# Patient Record
Sex: Male | Born: 1990
Health system: Southern US, Community
[De-identification: ages and names within clinical notes are randomized; demographics above are authoritative.]

## PROBLEM LIST (undated history)

## (undated) DIAGNOSIS — Z21 Asymptomatic human immunodeficiency virus [HIV] infection status: Secondary | ICD-10-CM

## (undated) DIAGNOSIS — B20 Human immunodeficiency virus [HIV] disease: Secondary | ICD-10-CM

## (undated) DIAGNOSIS — G43909 Migraine, unspecified, not intractable, without status migrainosus: Secondary | ICD-10-CM

## (undated) HISTORY — DX: Human immunodeficiency virus (HIV) disease: B20

## (undated) HISTORY — DX: Migraine, unspecified, not intractable, without status migrainosus: G43.909

## (undated) HISTORY — DX: Asymptomatic human immunodeficiency virus (hiv) infection status: Z21

## (undated) HISTORY — PX: NO PAST SURGERIES: SHX2092

---

## 2016-12-08 ENCOUNTER — Emergency Department (HOSPITAL_COMMUNITY)
Admission: EM | Admit: 2016-12-08 | Discharge: 2016-12-09 | Disposition: A | Discharge status: Home / Self Care | Payer: BC Managed Care – PPO | Attending: Emergency Medicine | Admitting: Emergency Medicine

## 2016-12-08 ENCOUNTER — Encounter (HOSPITAL_COMMUNITY): Payer: Self-pay | Admitting: Emergency Medicine

## 2016-12-08 DIAGNOSIS — R1013 Epigastric pain: Secondary | ICD-10-CM | POA: Diagnosis present

## 2016-12-08 DIAGNOSIS — K2980 Duodenitis without bleeding: Secondary | ICD-10-CM | POA: Diagnosis not present

## 2016-12-08 LAB — COMPREHENSIVE METABOLIC PANEL
ALT: 23 U/L (ref 17–63)
AST: 23 U/L (ref 15–41)
Albumin: 3.9 g/dL (ref 3.5–5.0)
Alkaline Phosphatase: 60 U/L (ref 38–126)
Anion gap: 7 (ref 5–15)
BUN: 10 mg/dL (ref 6–20)
CHLORIDE: 105 mmol/L (ref 101–111)
CO2: 27 mmol/L (ref 22–32)
CREATININE: 0.86 mg/dL (ref 0.61–1.24)
Calcium: 9.1 mg/dL (ref 8.9–10.3)
Glucose, Bld: 111 mg/dL — ABNORMAL HIGH (ref 65–99)
POTASSIUM: 4.3 mmol/L (ref 3.5–5.1)
Sodium: 139 mmol/L (ref 135–145)
Total Bilirubin: 0.2 mg/dL — ABNORMAL LOW (ref 0.3–1.2)
Total Protein: 7.5 g/dL (ref 6.5–8.1)

## 2016-12-08 LAB — CBC
HEMATOCRIT: 42 % (ref 39.0–52.0)
Hemoglobin: 14.3 g/dL (ref 13.0–17.0)
MCH: 28 pg (ref 26.0–34.0)
MCHC: 34 g/dL (ref 30.0–36.0)
MCV: 82.4 fL (ref 78.0–100.0)
PLATELETS: 373 10*3/uL (ref 150–400)
RBC: 5.1 MIL/uL (ref 4.22–5.81)
RDW: 13.3 % (ref 11.5–15.5)
WBC: 9.6 10*3/uL (ref 4.0–10.5)

## 2016-12-08 LAB — LIPASE, BLOOD: LIPASE: 22 U/L (ref 11–51)

## 2016-12-08 NOTE — ED Triage Notes (Signed)
Pt states he has had abd pain for several days worse for the past two  Pt states he has had diarrhea without vomiting  Pain is mainly in the upper abdomen  Has taken OTC meds without relief

## 2016-12-09 ENCOUNTER — Emergency Department (HOSPITAL_COMMUNITY): Payer: BC Managed Care – PPO

## 2016-12-09 ENCOUNTER — Encounter (HOSPITAL_COMMUNITY): Payer: Self-pay

## 2016-12-09 LAB — URINALYSIS, ROUTINE W REFLEX MICROSCOPIC
Bacteria, UA: NONE SEEN
Bilirubin Urine: NEGATIVE
Glucose, UA: NEGATIVE mg/dL
Hgb urine dipstick: NEGATIVE
KETONES UR: NEGATIVE mg/dL
Nitrite: NEGATIVE
PH: 6 (ref 5.0–8.0)
PROTEIN: NEGATIVE mg/dL
Specific Gravity, Urine: 1.024 (ref 1.005–1.030)

## 2016-12-09 MED ORDER — DICYCLOMINE HCL 20 MG PO TABS: 20.0000 mg | ORAL_TABLET | Freq: Two times a day (BID) | ORAL | 0 refills | Status: DC | PRN

## 2016-12-09 MED ORDER — PANTOPRAZOLE SODIUM 40 MG IV SOLR
40.0000 mg | Freq: Once | INTRAVENOUS | Status: AC
  Administered 2016-12-09: 40 mg via INTRAVENOUS
  Filled 2016-12-09: qty 40

## 2016-12-09 MED ORDER — SODIUM CHLORIDE 0.9 % IV BOLUS (SEPSIS)
1000.0000 mL | Freq: Once | INTRAVENOUS | Status: AC
  Administered 2016-12-09: 1000 mL via INTRAVENOUS

## 2016-12-09 MED ORDER — IOPAMIDOL (ISOVUE-300) INJECTION 61%
100.0000 mL | Freq: Once | INTRAVENOUS | Status: AC | PRN
  Administered 2016-12-09: 100 mL via INTRAVENOUS

## 2016-12-09 MED ORDER — IOPAMIDOL (ISOVUE-300) INJECTION 61%
INTRAVENOUS | Status: AC
  Filled 2016-12-09: qty 100

## 2016-12-09 MED ORDER — DICYCLOMINE HCL 10 MG/ML IM SOLN
20.0000 mg | Freq: Once | INTRAMUSCULAR | Status: AC
  Administered 2016-12-09: 20 mg via INTRAMUSCULAR
  Filled 2016-12-09: qty 2

## 2016-12-09 MED ORDER — PANTOPRAZOLE SODIUM 20 MG PO TBEC: 40.0000 mg | DELAYED_RELEASE_TABLET | Freq: Every day | ORAL | 1 refills | Status: DC

## 2016-12-09 MED ORDER — ONDANSETRON HCL 4 MG/2ML IJ SOLN
4.0000 mg | Freq: Once | INTRAMUSCULAR | Status: AC
  Administered 2016-12-09: 4 mg via INTRAVENOUS
  Filled 2016-12-09: qty 2

## 2016-12-09 NOTE — ED Notes (Signed)
Patient attempted to defecate, but was unsuccessful. Pt states "There was a lot of pressure, but nothing came out."

## 2016-12-09 NOTE — ED Provider Notes (Signed)
[facility]-EMERGENCY DEPT Provider Note   CSN: [id number] Arrival date & time: 12/08/16  2139    History   Chief Complaint Chief Complaint  Patient presents with  . Abdominal Pain    HPI Darin [name] is a 26 y.o. male.  26 year old male with no significant past medical history presents to the emergency department for evaluation of abdominal pain. Patient reports having abdominal cramping over the past 2-3 weeks. Cramping has been significantly worse over the past 3 days. Cramping is most notable in the patient's epigastrium. He has had multiple episodes of diarrhea daily. Symptoms not relieved with Pepto-Bismol. No nausea or vomiting. Patient states that he has had some perianal soreness of the result of frequent bowel movements. He did notice a bowel movement mixed with bright red blood today. No history of hematochezia previously. Patient denies history of abdominal surgeries. No known family history of IBS or IBD. No fevers or urinary symptoms or questionable food ingestions. Patient denies drinking water from a creek or stream. No contact with sick persons.      History reviewed. No pertinent past medical history.  There are no active problems to display for this patient.   History reviewed. No pertinent surgical history.     Home Medications    Prior to Admission medications   Medication Sig Start Date End Date Taking? Authorizing Provider  bismuth subsalicylate (PEPTO BISMOL) 262 MG/15ML suspension Take 30 mLs by mouth every 6 (six) hours as needed for indigestion.   Yes [provider]  dicyclomine (BENTYL) 20 MG tablet Take 1 tablet (20 mg total) by mouth 2 (two) times daily as needed (abdominal pain/cramping). 12/09/16   Antony MaduraHumes, Ansley Stanwood, PA-C  pantoprazole (PROTONIX) 20 MG tablet Take 2 tablets (40 mg total) by mouth daily. 12/09/16   Antony MaduraHumes, Simuel Stebner, PA-C    Family History History reviewed. No pertinent family history.  Social History Social History  Substance Use  Topics  . Smoking status: Never Smoker  . Smokeless tobacco: Never Used  . Alcohol use Yes     Comment: occ     Allergies   Penicillins   Review of Systems Review of Systems Ten systems reviewed and are negative for acute change, except as noted in the HPI.    Physical Exam Updated Vital Signs BP 108/62 (BP Location: Left Arm)   Pulse 62   Temp 97.9 F (36.6 C) (Oral)   Resp 18   SpO2 100%   Physical Exam  Constitutional: He is oriented to person, place, and time. He appears well-developed and well-nourished. No distress.  Nontoxic appearing and in NAD. Seems uncomfortable.  HENT:  Head: Normocephalic and atraumatic.  Eyes: Conjunctivae and EOM are normal. No scleral icterus.  Neck: Normal range of motion.  Cardiovascular: Normal rate, regular rhythm and intact distal pulses.   Pulmonary/Chest: Effort normal. No respiratory distress. He has no wheezes. He has no rales.  Respirations even and unlabored  Abdominal: Soft. He exhibits no mass. There is tenderness. There is guarding.  Soft abdomen with epigastric TTP and voluntary guarding. No distension or peritoneal signs.  Musculoskeletal: Normal range of motion.  Neurological: He is alert and oriented to person, place, and time. He exhibits normal muscle tone. Coordination normal.  Skin: Skin is warm and dry. No rash noted. He is not diaphoretic. No erythema. No pallor.  Psychiatric: He has a normal mood and affect. His behavior is normal.  Nursing note and vitals reviewed.    ED Treatments / Results  Labs (all  labs ordered are listed, but only abnormal results are displayed) Labs Reviewed  COMPREHENSIVE METABOLIC PANEL - Abnormal; Notable for the following:       Result Value   Glucose, Bld 111 (*)    Total Bilirubin 0.2 (*)    All other components within normal limits  URINALYSIS, ROUTINE W REFLEX MICROSCOPIC - Abnormal; Notable for the following:    Leukocytes, UA TRACE (*)    Squamous Epithelial / LPF 0-5  (*)    All other components within normal limits  GASTROINTESTINAL PANEL BY PCR, STOOL (REPLACES STOOL CULTURE)  LIPASE, BLOOD  CBC    EKG  EKG Interpretation None       Radiology Ct Abdomen Pelvis W Contrast  Result Date: 12/09/2016 CLINICAL DATA:  Upper abdominal pain for several days worse over the past 2 days with diarrhea. EXAM: CT ABDOMEN AND PELVIS WITH CONTRAST TECHNIQUE: Multidetector CT imaging of the abdomen and pelvis was performed using the standard protocol following bolus administration of intravenous contrast. CONTRAST:  ISOVUE-300 IOPAMIDOL (ISOVUE-300) INJECTION 61% COMPARISON:  None. FINDINGS: Lower chest: No acute abnormality. Hepatobiliary: Homogeneous attenuation of the liver without mass or biliary dilatation. Contracted gallbladder without stones. Pancreas: Normal Spleen: Normal Adrenals/Urinary Tract: Normal Stomach/Bowel: Physiologically distended stomach. There is mild thickening of the duodenum along its second and third portions which may represent changes of duodenitis. No bowel obstruction is seen. There is increased colonic stool burden throughout large bowel. The appendix is normal. Vascular/Lymphatic: No significant vascular findings are present. No enlarged abdominal or pelvic lymph nodes. Reproductive: Prostate is unremarkable. Other: No abdominal wall hernia or abnormality. No abdominopelvic ascites. Musculoskeletal: No acute or significant osseous findings. IMPRESSION: 1. Slight thickened appearance of the duodenum raising the possibility of a duodenitis. This may explain the patient's upper abdominal pain. No mechanical source of bowel obstruction. 2. No acute solid organ pathology. Electronically Signed   By: Tollie Eth M.D.   On: 12/09/2016 02:48    Procedures Procedures (including critical care time)  Medications Ordered in ED Medications  iopamidol (ISOVUE-300) 61 % injection (not administered)  sodium chloride 0.9 % bolus 1,000 mL (0 mLs  Intravenous Stopped 12/09/16 0300)  dicyclomine (BENTYL) injection 20 mg (20 mg Intramuscular Given 12/09/16 0143)  ondansetron (ZOFRAN) injection 4 mg (4 mg Intravenous Given 12/09/16 0142)  iopamidol (ISOVUE-300) 61 % injection 100 mL (100 mLs Intravenous Contrast Given 12/09/16 0203)  pantoprazole (PROTONIX) injection 40 mg (40 mg Intravenous Given 12/09/16 0441)     Initial Impression / Assessment and Plan / ED Course  I have reviewed the triage vital signs and the nursing notes.  Pertinent labs & imaging results that were available during my care of the patient were reviewed by me and considered in my medical decision making (see chart for details).     26 year old male presents to the emergency department for evaluation of persistent upper abdominal pain. Laboratory workup is reassuring. CT was obtained given degree of discomfort on exam. This shows findings consistent with duodenitis.  On repeat assessment, patient appears much more comfortable. He states that his discomfort has largely subsided following fluids and Bentyl. CT findings reviewed with patient and friend at bedside. Plan to refer to gastroenterology given chronicity of symptoms. We will also start on Protonix. Return precautions discussed and provided. Patient discharged in stable condition with no unaddressed concerns.   Final Clinical Impressions(s) / ED Diagnoses   Final diagnoses:  Acute duodenitis    New Prescriptions Discharge Medication List as  of 12/09/2016  4:05 AM    START taking these medications   Details  dicyclomine (BENTYL) 20 MG tablet Take 1 tablet (20 mg total) by mouth 2 (two) times daily as needed (abdominal pain/cramping)., Starting Wed 12/09/2016, Print    pantoprazole (PROTONIX) 20 MG tablet Take 2 tablets (40 mg total) by mouth daily., Starting Wed 12/09/2016, Print         Antony Madura, PA-C 12/09/16 1610    Zadie Rhine, MD 12/09/16 [phone]

## 2016-12-09 NOTE — Discharge Instructions (Signed)
We advise that you stop taking ibuprofen, Motrin, or Aleve as these may worsen your symptoms. Avoid alcohol. Take Protonix daily as prescribed. You may take Bentyl as needed for abdominal cramping. This can be supplemented with Tylenol, as needed. Call to schedule close follow up with a GI doctor. You may return for new or concerning symptoms.

## 2018-03-17 ENCOUNTER — Other Ambulatory Visit: Payer: Self-pay

## 2018-03-17 ENCOUNTER — Emergency Department (HOSPITAL_COMMUNITY)
Admission: EM | Admit: 2018-03-17 | Discharge: 2018-03-17 | Disposition: A | Discharge status: Home / Self Care | Payer: Managed Care, Other (non HMO) | Attending: Emergency Medicine | Admitting: Emergency Medicine

## 2018-03-17 ENCOUNTER — Encounter (HOSPITAL_COMMUNITY): Payer: Self-pay | Admitting: Emergency Medicine

## 2018-03-17 DIAGNOSIS — L02211 Cutaneous abscess of abdominal wall: Secondary | ICD-10-CM | POA: Insufficient documentation

## 2018-03-17 DIAGNOSIS — R1032 Left lower quadrant pain: Secondary | ICD-10-CM | POA: Diagnosis present

## 2018-03-17 NOTE — ED Triage Notes (Signed)
Patient has an abscess on left lower quadrant that has busted and is oozing.

## 2018-03-17 NOTE — ED Provider Notes (Signed)
See Epic downtime documentation.   Darin [name], Darin RuizJohn, MD 03/17/18 [phone]

## 2018-03-17 NOTE — ED Notes (Signed)
Bed: WA01 Expected date:  Expected time:  Means of arrival:  Comments: 

## 2018-05-04 ENCOUNTER — Other Ambulatory Visit (HOSPITAL_COMMUNITY)
Admission: RE | Admit: 2018-05-04 | Discharge: 2018-05-04 | Disposition: A | Discharge status: Home / Self Care | Payer: Managed Care, Other (non HMO) | Source: Ambulatory Visit | Attending: Infectious Diseases | Admitting: Infectious Diseases

## 2018-05-04 ENCOUNTER — Other Ambulatory Visit: Payer: Self-pay | Admitting: *Deleted

## 2018-05-04 ENCOUNTER — Ambulatory Visit: Payer: Managed Care, Other (non HMO)

## 2018-05-04 ENCOUNTER — Other Ambulatory Visit: Payer: Managed Care, Other (non HMO)

## 2018-05-04 DIAGNOSIS — B20 Human immunodeficiency virus [HIV] disease: Secondary | ICD-10-CM | POA: Insufficient documentation

## 2018-05-05 LAB — T-HELPER CELL (CD4) - (RCID CLINIC ONLY)
CD4 % Helper T Cell: 33 % (ref 33–55)
CD4 T Cell Abs: 770 /uL (ref 400–2700)

## 2018-05-05 LAB — URINE CYTOLOGY ANCILLARY ONLY
Chlamydia: NEGATIVE
Neisseria Gonorrhea: NEGATIVE

## 2018-05-12 ENCOUNTER — Encounter: Payer: Self-pay | Admitting: Infectious Diseases

## 2018-05-14 LAB — CBC WITH DIFFERENTIAL/PLATELET
BASOS ABS: 17 {cells}/uL (ref 0–200)
Basophils Relative: 0.3 %
Eosinophils Absolute: 80 cells/uL (ref 15–500)
Eosinophils Relative: 1.4 %
HEMATOCRIT: 45.5 % (ref 38.5–50.0)
Hemoglobin: 15.3 g/dL (ref 13.2–17.1)
Lymphs Abs: 2092 cells/uL (ref 850–3900)
MCH: 27.5 pg (ref 27.0–33.0)
MCHC: 33.6 g/dL (ref 32.0–36.0)
MCV: 81.8 fL (ref 80.0–100.0)
MPV: 10.1 fL (ref 7.5–12.5)
Monocytes Relative: 7.2 %
NEUTROS PCT: 54.4 %
Neutro Abs: 3101 cells/uL (ref 1500–7800)
PLATELETS: 360 10*3/uL (ref 140–400)
RBC: 5.56 10*6/uL (ref 4.20–5.80)
RDW: 13 % (ref 11.0–15.0)
TOTAL LYMPHOCYTE: 36.7 %
WBC mixed population: 410 cells/uL (ref 200–950)
WBC: 5.7 10*3/uL (ref 3.8–10.8)

## 2018-05-14 LAB — COMPLETE METABOLIC PANEL WITH GFR
AG Ratio: 1.6 (calc) (ref 1.0–2.5)
ALKALINE PHOSPHATASE (APISO): 55 U/L (ref 40–115)
ALT: 31 U/L (ref 9–46)
AST: 24 U/L (ref 10–40)
Albumin: 4.4 g/dL (ref 3.6–5.1)
BUN: 13 mg/dL (ref 7–25)
CALCIUM: 9.3 mg/dL (ref 8.6–10.3)
CO2: 26 mmol/L (ref 20–32)
Chloride: 105 mmol/L (ref 98–110)
Creat: 0.98 mg/dL (ref 0.60–1.35)
GFR, EST NON AFRICAN AMERICAN: 106 mL/min/{1.73_m2} (ref >=60)
GFR, Est African American: 123 mL/min/{1.73_m2} (ref >=60)
GLOBULIN: 2.8 g/dL (ref 1.9–3.7)
Glucose, Bld: 88 mg/dL (ref 65–99)
POTASSIUM: 4.3 mmol/L (ref 3.5–5.3)
SODIUM: 138 mmol/L (ref 135–146)
Total Bilirubin: 0.5 mg/dL (ref 0.2–1.2)
Total Protein: 7.2 g/dL (ref 6.1–8.1)

## 2018-05-14 LAB — HEPATITIS B CORE ANTIBODY, TOTAL: Hep B Core Total Ab: NONREACTIVE

## 2018-05-14 LAB — HIV ANTIBODY (ROUTINE TESTING W REFLEX): HIV: REACTIVE — AB

## 2018-05-14 LAB — QUANTIFERON-TB GOLD PLUS
MITOGEN-NIL: 9.95 [IU]/mL
NIL: 0.02 [IU]/mL
QuantiFERON-TB Gold Plus: NEGATIVE
TB1-NIL: 0 IU/mL
TB2-NIL: 0.01 [IU]/mL

## 2018-05-14 LAB — HEPATITIS A ANTIBODY, TOTAL: Hepatitis A AB,Total: REACTIVE — AB

## 2018-05-14 LAB — URINALYSIS
Bilirubin Urine: NEGATIVE
Glucose, UA: NEGATIVE
HGB URINE DIPSTICK: NEGATIVE
Ketones, ur: NEGATIVE
NITRITE: NEGATIVE
PROTEIN: NEGATIVE
Specific Gravity, Urine: 1.027 (ref 1.001–1.03)
pH: 5.5 (ref 5.0–8.0)

## 2018-05-14 LAB — HIV-1 GENOTYPE: HIV-1 Genotype: DETECTED — AB

## 2018-05-14 LAB — LIPID PANEL
Cholesterol: 165 mg/dL (ref <200)
HDL: 47 mg/dL (ref >40)
LDL CHOLESTEROL (CALC): 104 mg/dL — AB
Non-HDL Cholesterol (Calc): 118 mg/dL (calc) (ref <130)
TRIGLYCERIDES: 53 mg/dL (ref <150)
Total CHOL/HDL Ratio: 3.5 (calc) (ref <5.0)

## 2018-05-14 LAB — HEPATITIS C ANTIBODY
HEP C AB: NONREACTIVE
SIGNAL TO CUT-OFF: 0.09 (ref <1.00)

## 2018-05-14 LAB — HEPATITIS B SURFACE ANTIBODY,QUALITATIVE: Hep B S Ab: NONREACTIVE

## 2018-05-14 LAB — RPR: RPR Ser Ql: NONREACTIVE

## 2018-05-14 LAB — HIV-1 RNA ULTRAQUANT REFLEX TO GENTYP+
HIV 1 RNA QUANT: 14100 {copies}/mL — AB
HIV-1 RNA QUANT, LOG: 4.15 {Log_copies}/mL — AB

## 2018-05-14 LAB — HIV-1/2 AB - DIFFERENTIATION
HIV-1 antibody: POSITIVE — AB
HIV-2 Ab: NEGATIVE

## 2018-05-14 LAB — HLA B*5701: HLA-B*5701 w/rflx HLA-B High: NEGATIVE

## 2018-05-14 LAB — HEPATITIS B SURFACE ANTIGEN: HEP B S AG: NONREACTIVE

## 2018-05-22 ENCOUNTER — Encounter: Payer: Self-pay | Admitting: Infectious Diseases

## 2018-05-22 DIAGNOSIS — B20 Human immunodeficiency virus [HIV] disease: Secondary | ICD-10-CM | POA: Insufficient documentation

## 2018-05-23 ENCOUNTER — Ambulatory Visit (INDEPENDENT_AMBULATORY_CARE_PROVIDER_SITE_OTHER): Payer: Managed Care, Other (non HMO) | Admitting: Infectious Diseases

## 2018-05-23 ENCOUNTER — Encounter: Payer: Self-pay | Admitting: Infectious Diseases

## 2018-05-23 ENCOUNTER — Ambulatory Visit (INDEPENDENT_AMBULATORY_CARE_PROVIDER_SITE_OTHER): Payer: Managed Care, Other (non HMO) | Admitting: Pharmacist

## 2018-05-23 VITALS — BP 132/82 | HR 72 | Temp 97.5°F | Wt 211.0 lb

## 2018-05-23 DIAGNOSIS — Z23 Encounter for immunization: Secondary | ICD-10-CM | POA: Diagnosis not present

## 2018-05-23 DIAGNOSIS — Z21 Asymptomatic human immunodeficiency virus [HIV] infection status: Secondary | ICD-10-CM | POA: Diagnosis not present

## 2018-05-23 DIAGNOSIS — Z0000 Encounter for general adult medical examination without abnormal findings: Secondary | ICD-10-CM | POA: Insufficient documentation

## 2018-05-23 DIAGNOSIS — B20 Human immunodeficiency virus [HIV] disease: Secondary | ICD-10-CM | POA: Diagnosis not present

## 2018-05-23 MED ORDER — BICTEGRAVIR-EMTRICITAB-TENOFOV 50-200-25 MG PO TABS: 1.0000 | ORAL_TABLET | Freq: Every day | ORAL | 3 refills | Status: DC

## 2018-05-23 MED FILL — BIKTARVY 50-200-25 MG TABS: 50-200-25 | 30 days supply | Qty: 30 | Fill #0

## 2018-05-23 NOTE — Assessment & Plan Note (Signed)
New patient here to establish for HIV care. I discussed with Darin [name] treatment options/side effects, benefits of treatment and long-term outcomes. I discussed how HIV is transmitted and the process of untreated HIV including increased risk for opportunistic infections, cancer, dementia and renal failure. he was counseled on routine HIV care including medication adherence, blood monitoring, necessary vaccines and follow up visits. Counseled regarding safe sex practices including: condom use, partner disclosure, limiting partners. He has a partner that he has had unprotected sex with he is currently involved with and has not had testing yet - recommended he get tested either with partner test here or [facility]. He spent time talking with our pharmacist Cassie regarding successful practices of ART and understands to reach out to our clinic in the future with questions.   Will start him on Biktarvy. [facility] Will return to clinic in 4 weeks to re-check VL and continue HIV education with our pharmacy team and with me again in 8 weeks.   I spent greater than 45 minutes with the patient today. Greater than 50% of the time spent face-to-face counseling and coordination of care re: HIV and health maintenance.

## 2018-05-23 NOTE — Assessment & Plan Note (Addendum)
Prevnar vaccine today. He needs Hep B #1 at pharmacy visit. Will give Hep B #2 and Pneumovax at next appt with me. Will get childhood vax history to determine if he needs meningococcal and HPV as well.

## 2018-05-23 NOTE — Patient Instructions (Addendum)
Wonderful to meet you today!   Will start you on a new pill called Biktarvy once a day - this will need to be taken once a day around the same time.  - Common side effects for a short time frame usually include headaches, nausea and diarrhea - OK to take over the counter tylenol for headaches and imodium for diarrhea - Try taking with food if you are nauseated  - Please separate from other vitamins/supplements or minerals by 6 hours please.   Would like for you to follow up with Cassie in 4 weeks and Judeth CornfieldStephanie again in 2 months.   We gave you your Prevnar vaccine today and will give you your first hep b vaccine in 1 month at your next visit.    Please sign up with MyChart to access your labs and set up email communication with our clinic for non-urgent medical concerns.

## 2018-05-23 NOTE — Progress Notes (Signed)
HPI: Darin [name] is a 27 y.o. male who presents to the [facility] clinic to initiate care with Judeth Cornfield for his HIV infection.  Patient Active Problem List   Diagnosis Date Noted  . Healthcare maintenance 05/23/2018  . HIV (human immunodeficiency virus infection) (HCC) 05/22/2018    Patient's Medications  New Prescriptions   BICTEGRAVIR-EMTRICITABINE-TENOFOVIR AF (BIKTARVY) 50-200-25 MG TABS TABLET    Take 1 tablet by mouth daily.  Previous Medications   ASPIRIN-ACETAMINOPHEN-CAFFEINE (EXCEDRIN MIGRAINE) 250-250-65 MG TABLET    Take 2 tablets by mouth every 6 (six) hours as needed for headache.   BIOTIN 1 MG CAPS    Take by mouth.   BIOTIN 10 MG TABS    Take 1 tablet by mouth daily.  Modified Medications   No medications on file  Discontinued Medications   No medications on file    Allergies: Allergies  Allergen Reactions  . Penicillins Hives    As a kid  Has patient had a PCN reaction causing immediate rash, facial/tongue/throat swelling, SOB or lightheadedness with hypotension: Yes Has patient had a PCN reaction causing severe rash involving mucus membranes or skin necrosis: No Has patient had a PCN reaction that required hospitalization: No Has patient had a PCN reaction occurring within the last 10 years: No If all of the above answers are "NO", then may proceed with Cephalosporin use.     Past Medical History: Past Medical History:  Diagnosis Date  . HIV infection (HCC)   . Migraines     Social History: Social History   Socioeconomic History  . Marital status: Single    Spouse name: Not on file  . Number of children: Not on file  . Years of education: Not on file  . Highest education level: Not on file  Occupational History  . Not on file  Social Needs  . Financial resource strain: Not on file  . Food insecurity:    Worry: Not on file    Inability: Not on file  . Transportation needs:    Medical: Not on file    Non-medical: Not on file  Tobacco Use  .  Smoking status: Never Smoker  . Smokeless tobacco: Never Used  Substance and Sexual Activity  . Alcohol use: Yes    Comment: occ  . Drug use: No  . Sexual activity: Yes    Partners: Male    Birth control/protection: Condom  Lifestyle  . Physical activity:    Days per week: Not on file    Minutes per session: Not on file  . Stress: Not on file  Relationships  . Social connections:    Talks on phone: Not on file    Gets together: Not on file    Attends religious service: Not on file    Active member of club or organization: Not on file    Attends meetings of clubs or organizations: Not on file    Relationship status: Not on file  Other Topics Concern  . Not on file  Social History Narrative  . Not on file    Labs: Lab Results  Component Value Date   HIV1RNAQUANT 14,100 (H) 05/04/2018   CD4TABS 770 05/04/2018    RPR and STI Lab Results  Component Value Date   LABRPR NON-REACTIVE 05/04/2018    STI Results GC CT  05/04/2018 Negative Negative    Hepatitis B Lab Results  Component Value Date   HEPBSAB NON-REACTIVE 05/04/2018   HEPBSAG NON-REACTIVE 05/04/2018   HEPBCAB NON-REACTIVE 05/04/2018  Hepatitis C Lab Results  Component Value Date   HEPCAB NON-REACTIVE 05/04/2018   Hepatitis A Lab Results  Component Value Date   HAV REACTIVE (A) 05/04/2018   Lipids: Lab Results  Component Value Date   CHOL 165 05/04/2018   TRIG 53 05/04/2018   HDL 47 05/04/2018   CHOLHDL 3.5 05/04/2018   LDLCALC 104 (H) 05/04/2018    Current HIV Regimen: Naive  Assessment: Darin [name] comes in today to initiate care for his newly diagnosed HIV infection.  He has an initial HIV viral load of 14,100 and a CD4 count of 770.  His kidney function is good at baseline and he has no resistance on his genotype. We will start him on Biktarvy today.  Explained that Darin [name] is a one pill once daily medication with or without food and the importance of not missing any doses. Explained  resistance and how it develops and why it is so important to take Biktarvy daily and not skip days or doses. Counseled patient to take it around the same time each day. Counseled on what to do if dose is missed, if closer to missed dose take immediately, if closer to next dose then skip and resume normal schedule.   Cautioned on possible side effects the first week or so including nausea, diarrhea, dizziness, and headaches but that they should resolve after the first couple of weeks. I reviewed patient medications and found no drug interactions. Counseled patient to separate Biktarvy from divalent cations including multivitamins. Discussed with patient to call clinic if he starts a new medication or herbal supplement. I gave the patient my card and told him to call me with any issues/questions/concerns.  [facility]  Plan: - Start Biktarvy PO once daily [facility]  Darin [name], PharmD, AAHIVP, CPP Infectious Diseases Clinical Pharmacist [facility] 05/23/2018, 3:21 PM

## 2018-05-23 NOTE — Progress Notes (Signed)
Name: Darin RamShane Mattila  DOB: 1990/09/08 MRN: [id number] PCP: Jackie Plumsei-Bonsu, George, MD   Patient Active Problem List   Diagnosis Date Noted  . Healthcare maintenance 05/23/2018  . HIV (human immunodeficiency virus infection) (HCC) 05/22/2018     Brief Narrative:  Darin [name] is a 27 y.o. male with HIV infection originally dx 04/2018. History of OIs: none. HIV Risk: MSM  Previous Regimens: . naive  Genotypes: . 05-2018: no mutations detected   Subjective:  Darin [name] is here today to establish care for HIV disease.   Darin [name] is an otherwise 27 y.o. health young male. He is in school at [employer] for engineering. Working full time at [employer] as well. Previously was tested every 2 months but last tested in March of this year. He has heard of PrEP but never thought he needed it. He was initially shocked and paniced when he found out but feeling better now. Has researched a little bit about HIV and understands how it is transmitted. Endorses no complaints today suggestive of associated opportunistic infection or advancing HIV disease such as fevers, night sweats, weight loss, anorexia, cough, SOB, nausea, vomiting, diarrhea, headache, sensory changes, lymphadenopathy or oral thrush.  He is ready to start on medications if we recommend this.   He has had his flu shot this year. Exercises occasionally but will be off night shift soon so he can dedicate more time to this now. Eats varied diet and prefers to be a lower weight, has picked up some with stress and off-shift work. He prefers male partners and uses condoms most of the time. Thinks he may have had gonorrhea in the past. Was tested with oral/anal swabs in October and negative then.   Review of Systems  Constitutional: Negative for chills, fever, malaise/fatigue and weight loss.  HENT: Negative for sore throat.        No dental problems  Respiratory: Negative for cough and sputum production.   Cardiovascular: Negative for chest pain and leg swelling.   Gastrointestinal: Negative for abdominal pain, diarrhea and vomiting.  Genitourinary: Negative for dysuria and flank pain.  Musculoskeletal: Negative for joint pain, myalgias and neck pain.  Skin: Negative for rash.  Neurological: Negative for dizziness, tingling and headaches.  Psychiatric/Behavioral: Negative for depression and substance abuse. The patient is not nervous/anxious and does not have insomnia.     Past Medical History:  Diagnosis Date  . HIV infection (HCC)   . Migraines     Outpatient Medications Prior to Visit  Medication Sig Dispense Refill  . aspirin-acetaminophen-caffeine (EXCEDRIN MIGRAINE) 250-250-65 MG tablet Take 2 tablets by mouth every 6 (six) hours as needed for headache.    . Biotin 1 MG CAPS Take by mouth.    . Biotin 10 MG TABS Take 1 tablet by mouth daily.    Marland Kitchen. bismuth subsalicylate (PEPTO BISMOL) 262 MG/15ML suspension Take 30 mLs by mouth every 6 (six) hours as needed for indigestion.    . dicyclomine (BENTYL) 20 MG tablet Take 1 tablet (20 mg total) by mouth 2 (two) times daily as needed (abdominal pain/cramping). 20 tablet 0  . pantoprazole (PROTONIX) 20 MG tablet Take 2 tablets (40 mg total) by mouth daily. 60 tablet 1   No facility-administered medications prior to visit.      Allergies  Allergen Reactions  . Penicillins Hives    As a kid  Has patient had a PCN reaction causing immediate rash, facial/tongue/throat swelling, SOB or lightheadedness with hypotension: Yes Has patient had a PCN reaction  causing severe rash involving mucus membranes or skin necrosis: No Has patient had a PCN reaction that required hospitalization: No Has patient had a PCN reaction occurring within the last 10 years: No If all of the above answers are "NO", then may proceed with Cephalosporin use.     Social History   Tobacco Use  . Smoking status: Never Smoker  . Smokeless tobacco: Never Used  Substance Use Topics  . Alcohol use: Yes    Comment: occ  .  Drug use: No    Family History  Problem Relation Age of Onset  . Migraines Mother   . Healthy Mother   . Migraines Father   . Healthy Father   . Healthy Sister   . AAA (abdominal aortic aneurysm) Maternal Grandfather   . Hypertension Maternal Grandfather   . Diabetes Paternal Grandmother   . Cancer Paternal Grandmother   . Healthy Brother   . Healthy Brother     Social History   Substance and Sexual Activity  Sexual Activity Yes  . Partners: Male  . Birth control/protection: Condom     Objective:   Vitals:   05/23/18 0904  BP: 132/82  Pulse: 72  Temp: (!) 97.5 F (36.4 C)  TempSrc: Oral  Weight: 211 lb (95.7 kg)   Body mass index is 28.62 kg/m.  Physical Exam  Constitutional: He is oriented to person, place, and time. He appears well-developed and well-nourished.  Seated comfortably in chair during visit.   HENT:  Mouth/Throat: Oropharynx is clear and moist and mucous membranes are normal. Normal dentition. No dental abscesses.  Cardiovascular: Normal rate, regular rhythm and normal heart sounds.  Pulmonary/Chest: Effort normal and breath sounds normal.  Abdominal: Soft. He exhibits no distension. There is no tenderness.  Lymphadenopathy:    He has no cervical adenopathy.  Neurological: He is alert and oriented to person, place, and time.  Skin: Skin is warm and dry. No rash noted.  Psychiatric: He has a normal mood and affect. Judgment normal.  In good spirits today and engaged in care discussion.     Lab Results Lab Results  Component Value Date   WBC 5.7 05/04/2018   HGB 15.3 05/04/2018   HCT 45.5 05/04/2018   MCV 81.8 05/04/2018   PLT 360 05/04/2018    Lab Results  Component Value Date   CREATININE 0.98 05/04/2018   BUN 13 05/04/2018   NA 138 05/04/2018   K 4.3 05/04/2018   CL 105 05/04/2018   CO2 26 05/04/2018    Lab Results  Component Value Date   ALT 31 05/04/2018   AST 24 05/04/2018   ALKPHOS 60 12/08/2016   BILITOT 0.5  05/04/2018    Lab Results  Component Value Date   CHOL 165 05/04/2018   HDL 47 05/04/2018   LDLCALC 104 (H) 05/04/2018   TRIG 53 05/04/2018   CHOLHDL 3.5 05/04/2018   HIV 1 RNA Quant (copies/mL)  Date Value  05/04/2018 14,100 (H)   CD4 T Cell Abs (/uL)  Date Value  05/04/2018 770     Assessment & Plan:   Problem List Items Addressed This Visit      Unprioritized   Healthcare maintenance    Prevnar vaccine today. He needs Hep B #1 at pharmacy visit. Will give Hep B #2 and Pneumovax at next appt with me. Will get childhood vax history to determine if he needs meningococcal and HPV as well.       HIV (human immunodeficiency virus infection) (  HCC) - Primary    New patient here to establish for HIV care. I discussed with Sylvestre Rathgeber treatment options/side effects, benefits of treatment and long-term outcomes. I discussed how HIV is transmitted and the process of untreated HIV including increased risk for opportunistic infections, cancer, dementia and renal failure. he was counseled on routine HIV care including medication adherence, blood monitoring, necessary vaccines and follow up visits. Counseled regarding safe sex practices including: condom use, partner disclosure, limiting partners. He has a partner that he has had unprotected sex with he is currently involved with and has not had testing yet - recommended he get tested either with partner test here or at [facility]. He spent time talking with our pharmacist Cassie regarding successful practices of ART and understands to reach out to our clinic in the future with questions.   Will start him on Biktarvy. He will receive medications from [facility]. Will return to clinic in 4 weeks to re-check VL and continue HIV education with our pharmacy team and with me again in 8 weeks.   I spent greater than 45 minutes with the patient today. Greater than 50% of the time spent face-to-face counseling and coordination of care re: HIV and  health maintenance.        Relevant Orders   Pneumococcal conjugate vaccine 13-valent IM (Completed)    Other Visit Diagnoses    Need for vaccination with 13-polyvalent pneumococcal conjugate vaccine       Relevant Orders   Pneumococcal conjugate vaccine 13-valent IM (Completed)      Rexene Alberts, MSN, NP-C [facility] Pager: [phone] Office: [phone]  05/23/18  1:07 PM

## 2018-06-07 ENCOUNTER — Encounter: Payer: Self-pay | Admitting: Infectious Diseases

## 2018-06-17 MED FILL — BIKTARVY 50-200-25 MG TABS: 50-200-25 | 30 days supply | Qty: 30 | Fill #1

## 2018-07-15 MED FILL — BIKTARVY 50-200-25 MG TABS: 50-200-25 | 30 days supply | Qty: 30 | Fill #2

## 2018-08-08 ENCOUNTER — Ambulatory Visit: Payer: Managed Care, Other (non HMO) | Admitting: Infectious Diseases

## 2018-08-11 ENCOUNTER — Encounter: Payer: Self-pay | Admitting: Infectious Diseases

## 2018-08-11 ENCOUNTER — Ambulatory Visit (INDEPENDENT_AMBULATORY_CARE_PROVIDER_SITE_OTHER): Payer: BLUE CROSS/BLUE SHIELD | Admitting: Infectious Diseases

## 2018-08-11 VITALS — BP 133/83 | HR 56 | Temp 98.6°F | Wt 217.0 lb

## 2018-08-11 DIAGNOSIS — Z880 Allergy status to penicillin: Secondary | ICD-10-CM | POA: Diagnosis not present

## 2018-08-11 DIAGNOSIS — Z21 Asymptomatic human immunodeficiency virus [HIV] infection status: Secondary | ICD-10-CM

## 2018-08-11 DIAGNOSIS — B20 Human immunodeficiency virus [HIV] disease: Secondary | ICD-10-CM

## 2018-08-11 DIAGNOSIS — Z23 Encounter for immunization: Secondary | ICD-10-CM | POA: Diagnosis not present

## 2018-08-11 DIAGNOSIS — Z79899 Other long term (current) drug therapy: Secondary | ICD-10-CM | POA: Diagnosis not present

## 2018-08-11 DIAGNOSIS — Z0000 Encounter for general adult medical examination without abnormal findings: Secondary | ICD-10-CM

## 2018-08-11 MED ORDER — BICTEGRAVIR-EMTRICITAB-TENOFOV 50-200-25 MG PO TABS: 1.0000 | ORAL_TABLET | Freq: Every day | ORAL | 3 refills | Status: DC

## 2018-08-11 NOTE — Assessment & Plan Note (Signed)
Pneumovax today. Menveo and HB#1 at upcoming visit.

## 2018-08-11 NOTE — Assessment & Plan Note (Signed)
It sounds that he is doing very well on his biktarvy and adjusting to taking a pill once a day. Will repeat viral load today. Reviewed labs.  Discussed U=U concept in addition to safe sex counseling and prevention of other STIs.   Offered but declined condoms.  RTC 3m

## 2018-08-11 NOTE — Patient Instructions (Signed)
You look wonderful! I hope you continue to be well in 2020.   Please continue your biktarvy once a day every day as you are.   Tips for Successful Daily Medication Habits: 1. Set a reminder on your phone  2. Try filling out a pill box for the week - pick a day and put one pill for every day during the week so you know right away if you missed a pill.  3. Have a trusted family member ask you about your medications.  4. Smartphone app   Remember every pill is precious and we want to be able to continue treating your with confidence the rest of your life.   Vaccines given today   Pneumovax (pneumonia vaccine - next due in 2025)  Please come back in 3 months to check in again.

## 2018-08-11 NOTE — Progress Notes (Signed)
Name: Darin [name]  DOB: 1990-10-22 MRN: [id number] PCP: Jackie Plum, MD   Patient Active Problem List   Diagnosis Date Noted  . Healthcare maintenance 05/23/2018  . HIV (human immunodeficiency virus infection) (HCC) 05/22/2018     Brief Narrative:  Darin [name] is a 28 y.o. male with HIV infection originally dx 04/2018. History of OIs: none. HIV Risk: MSM  Previous Regimens: . naive  Genotypes: . 05-2018: no mutations detected   Subjective:  CC: HIV follow up care. No complaints.   HPI:  Darin [name] has been feeling well since our last visit together. He has had some struggles and missed doses with his biktarvy but estimates only 1 missed does in the last 2 weeks and can articulate specific day. No noticeable side effects from medications. He has been working full time and has new insurance provider now. New sexual partner - using condoms consistently.   Otherwise no changes to his health history. No hospitalizations or periods of illnesses since last OV.   Review of Systems  Constitutional: Negative for chills, fever, malaise/fatigue and weight loss.  HENT: Negative for sore throat.        No dental problems  Respiratory: Negative for cough and sputum production.   Cardiovascular: Negative for chest pain and leg swelling.  Gastrointestinal: Negative for abdominal pain, diarrhea and vomiting.  Genitourinary: Negative for dysuria and flank pain.  Musculoskeletal: Negative for joint pain, myalgias and neck pain.  Skin: Negative for rash.  Neurological: Negative for dizziness, tingling and headaches.  Psychiatric/Behavioral: Negative for depression and substance abuse. The patient is not nervous/anxious and does not have insomnia.     Past Medical History:  Diagnosis Date  . HIV infection (HCC)   . Migraines     Outpatient Medications Prior to Visit  Medication Sig Dispense Refill  . aspirin-acetaminophen-caffeine (EXCEDRIN MIGRAINE) 250-250-65 MG tablet Take 2 tablets by  mouth every 6 (six) hours as needed for headache.    . Biotin 1 MG CAPS Take by mouth.    . bictegravir-emtricitabine-tenofovir AF (BIKTARVY) 50-200-25 MG TABS tablet Take 1 tablet by mouth daily. 30 tablet 3  . Biotin 10 MG TABS Take 1 tablet by mouth daily.     No facility-administered medications prior to visit.      Allergies  Allergen Reactions  . Penicillins Hives    As a kid  Has patient had a PCN reaction causing immediate rash, facial/tongue/throat swelling, SOB or lightheadedness with hypotension: Yes Has patient had a PCN reaction causing severe rash involving mucus membranes or skin necrosis: No Has patient had a PCN reaction that required hospitalization: No Has patient had a PCN reaction occurring within the last 10 years: No If all of the above answers are "NO", then may proceed with Cephalosporin use.     Social History   Tobacco Use  . Smoking status: Never Smoker  . Smokeless tobacco: Never Used  Substance Use Topics  . Alcohol use: Yes    Comment: occ  . Drug use: No    Social History   Substance and Sexual Activity  Sexual Activity Yes  . Partners: Male  . Birth control/protection: Condom     Objective:   Vitals:   08/11/18 1511  BP: 133/83  Pulse: (!) 56  Temp: 98.6 F (37 C)  Weight: 217 lb (98.4 kg)   Body mass index is 29.43 kg/m.  Physical Exam Constitutional:      Appearance: He is well-developed.     Comments: Seated comfortably  in chair during visit.   HENT:     Mouth/Throat:     Dentition: Normal dentition. No dental abscesses.  Cardiovascular:     Rate and Rhythm: Normal rate and regular rhythm.     Heart sounds: Normal heart sounds.  Pulmonary:     Effort: Pulmonary effort is normal.     Breath sounds: Normal breath sounds.  Abdominal:     General: There is no distension.     Palpations: Abdomen is soft.     Tenderness: There is no abdominal tenderness.  Lymphadenopathy:     Cervical: No cervical adenopathy.   Skin:    General: Skin is warm and dry.     Findings: No rash.  Neurological:     Mental Status: He is alert and oriented to person, place, and time.  Psychiatric:        Judgment: Judgment normal.     Comments: In good spirits today and engaged in care discussion.      Lab Results Lab Results  Component Value Date   WBC 5.7 05/04/2018   HGB 15.3 05/04/2018   HCT 45.5 05/04/2018   MCV 81.8 05/04/2018   PLT 360 05/04/2018    Lab Results  Component Value Date   CREATININE 0.98 05/04/2018   BUN 13 05/04/2018   NA 138 05/04/2018   K 4.3 05/04/2018   CL 105 05/04/2018   CO2 26 05/04/2018    Lab Results  Component Value Date   ALT 31 05/04/2018   AST 24 05/04/2018   ALKPHOS 60 12/08/2016   BILITOT 0.5 05/04/2018    Lab Results  Component Value Date   CHOL 165 05/04/2018   HDL 47 05/04/2018   LDLCALC 104 (H) 05/04/2018   TRIG 53 05/04/2018   CHOLHDL 3.5 05/04/2018   HIV 1 RNA Quant (copies/mL)  Date Value  05/04/2018 14,100 (H)   CD4 T Cell Abs (/uL)  Date Value  05/04/2018 770     Assessment & Plan:   Problem List Items Addressed This Visit      Unprioritized   Healthcare maintenance    Pneumovax today. Menveo and HB#1 at upcoming visit.       HIV (human immunodeficiency virus infection) (HCC)    It sounds that he is doing very well on his biktarvy and adjusting to taking a pill once a day. Will repeat viral load today. Reviewed labs.  Discussed U=U concept in addition to safe sex counseling and prevention of other STIs.   Offered but declined condoms.  RTC 21m      Relevant Medications   bictegravir-emtricitabine-tenofovir AF (BIKTARVY) 50-200-25 MG TABS tablet    Other Visit Diagnoses    Human immunodeficiency virus (HIV) disease (HCC)    -  Primary   Relevant Medications   bictegravir-emtricitabine-tenofovir AF (BIKTARVY) 50-200-25 MG TABS tablet      Rexene Alberts, MSN, NP-C [facility] Pager: [phone] Office: [phone]  08/11/18  3:47 PM

## 2018-08-13 LAB — HIV-1 RNA QUANT-NO REFLEX-BLD
HIV 1 RNA Quant: <20 copies/mL — AB
HIV-1 RNA Quant, Log: <1.3 Log copies/mL — AB

## 2018-08-15 MED FILL — BIKTARVY 50-200-25 MG TABS: 50-200-25 | 30 days supply | Qty: 30 | Fill #3

## 2018-09-12 MED FILL — BIKTARVY 50-200-25 MG TABS: 50-200-25 | 30 days supply | Qty: 30 | Fill #0

## 2018-10-11 ENCOUNTER — Encounter: Payer: Self-pay | Admitting: Pharmacy Technician

## 2018-10-20 MED FILL — BIKTARVY 50-200-25 MG TABS: 50-200-25 | 30 days supply | Qty: 30 | Fill #1

## 2018-10-23 ENCOUNTER — Emergency Department (HOSPITAL_COMMUNITY)
Admission: EM | Admit: 2018-10-23 | Discharge: 2018-10-24 | Disposition: A | Discharge status: Home / Self Care | Payer: BLUE CROSS/BLUE SHIELD | Attending: Emergency Medicine | Admitting: Emergency Medicine

## 2018-10-23 ENCOUNTER — Other Ambulatory Visit: Payer: Self-pay

## 2018-10-23 ENCOUNTER — Encounter (HOSPITAL_COMMUNITY): Payer: Self-pay

## 2018-10-23 DIAGNOSIS — R21 Rash and other nonspecific skin eruption: Secondary | ICD-10-CM | POA: Insufficient documentation

## 2018-10-23 DIAGNOSIS — B20 Human immunodeficiency virus [HIV] disease: Secondary | ICD-10-CM | POA: Diagnosis not present

## 2018-10-23 DIAGNOSIS — Z79899 Other long term (current) drug therapy: Secondary | ICD-10-CM | POA: Insufficient documentation

## 2018-10-23 DIAGNOSIS — L509 Urticaria, unspecified: Secondary | ICD-10-CM | POA: Diagnosis not present

## 2018-10-23 MED ORDER — DIPHENHYDRAMINE HCL 25 MG PO CAPS
25.0000 mg | ORAL_CAPSULE | Freq: Once | ORAL | Status: AC
  Administered 2018-10-23: 23:00:00 25 mg via ORAL
  Filled 2018-10-23: qty 1

## 2018-10-23 MED ORDER — METHYLPREDNISOLONE SODIUM SUCC 125 MG IJ SOLR
125.0000 mg | Freq: Once | INTRAMUSCULAR | Status: AC
  Administered 2018-10-23: 125 mg via INTRAMUSCULAR
  Filled 2018-10-23: qty 2

## 2018-10-23 MED ORDER — FAMOTIDINE 20 MG PO TABS
20.0000 mg | ORAL_TABLET | Freq: Once | ORAL | Status: AC
  Administered 2018-10-23: 20 mg via ORAL
  Filled 2018-10-23: qty 1

## 2018-10-23 NOTE — ED Provider Notes (Signed)
[facility]-EMERGENCY DEPT Provider Note   CSN: [id number] Arrival date & time: 10/23/18  2238    History   Chief Complaint Chief Complaint  Patient presents with  . Rash    HPI York RamShane Humm is a 28 y.o. male.     The history is provided by the patient and medical records.  Rash     28 y.o. M with hx of HIV, migraines, presenting to the ED for rash.  Patient states this started yesterday after eating seafood with certain type of seasoning.  This was takeout, not food he cooked.  He states he eats seafood all the time, but has not had this particular type of seasoning in over a year since [place].  States he has rash on his face, neck, chest, and somewhat on the arms.  He denies any SOB, difficulty swallowing, or sensation of throat closing. He took bendaryl yesterday which helped a little.  He got a prescription from urgent care for prednisone and has only taken one dose earlier this afternoon but states now his face is itching and burning.  He has no known allergies aside from penicillin.  No changes in soaps, detergents, shampoo, face wash, or other personal care products.  Past Medical History:  Diagnosis Date  . HIV infection (HCC)   . Migraines     Patient Active Problem List   Diagnosis Date Noted  . Healthcare maintenance 05/23/2018  . HIV (human immunodeficiency virus infection) (HCC) 05/22/2018    Past Surgical History:  Procedure Laterality Date  . NO PAST SURGERIES          Home Medications    Prior to Admission medications   Medication Sig Start Date End Date Taking? Authorizing Provider  aspirin-acetaminophen-caffeine (EXCEDRIN MIGRAINE) [id number] MG tablet Take 2 tablets by mouth every 6 (six) hours as needed for headache.    [provider]  bictegravir-emtricitabine-tenofovir AF (BIKTARVY) 50-200-25 MG TABS tablet Take 1 tablet by mouth daily. 08/11/18   Blanchard Kelchixon, Stephanie N, NP  Biotin 1 MG CAPS Take by mouth.     [provider]    Family History Family History  Problem Relation Age of Onset  . Migraines Mother   . Healthy Mother   . Migraines Father   . Healthy Father   . Healthy Sister   . AAA (abdominal aortic aneurysm) Maternal Grandfather   . Hypertension Maternal Grandfather   . Diabetes Paternal Grandmother   . Cancer Paternal Grandmother   . Healthy Brother   . Healthy Brother     Social History Social History   Tobacco Use  . Smoking status: Never Smoker  . Smokeless tobacco: Never Used  Substance Use Topics  . Alcohol use: Yes    Comment: occ  . Drug use: No     Allergies   Penicillins   Review of Systems Review of Systems  Skin: Positive for rash.  All other systems reviewed and are negative.    Physical Exam Updated Vital Signs BP (!) 136/91 (BP Location: Left Arm)   Pulse 90   Temp 98.4 F (36.9 C) (Oral)   Resp 18   SpO2 99%   Physical Exam Vitals signs and nursing note reviewed.  Constitutional:      Appearance: He is well-developed.  HENT:     Head: Normocephalic and atraumatic.     Mouth/Throat:     Comments: Airway intact, no noted swelling, no difficulty swallowing, handling secretions well Eyes:  Conjunctiva/sclera: Conjunctivae normal.     Pupils: Pupils are equal, round, and reactive to light.  Neck:     Musculoskeletal: Normal range of motion.  Cardiovascular:     Rate and Rhythm: Normal rate and regular rhythm.     Heart sounds: Normal heart sounds.  Pulmonary:     Effort: Pulmonary effort is normal.     Breath sounds: Normal breath sounds.  Abdominal:     General: Bowel sounds are normal.     Palpations: Abdomen is soft.  Musculoskeletal: Normal range of motion.  Skin:    General: Skin is warm and dry.     Findings: Rash present. Rash is urticarial.     Comments: Urticarial rash across the face, neck, chest, and a little on the arms; no lesions on palms/soles  Neurological:     Mental Status: He is alert  and oriented to person, place, and time.      ED Treatments / Results  Labs (all labs ordered are listed, but only abnormal results are displayed) Labs Reviewed - No data to display  EKG None  Radiology No results found.  Procedures Procedures (including critical care time)  Medications Ordered in ED Medications  methylPREDNISolone sodium succinate (SOLU-MEDROL) 125 mg/2 mL injection 125 mg (125 mg Intramuscular Given 10/23/18 2326)  famotidine (PEPCID) tablet 20 mg (20 mg Oral Given 10/23/18 2325)  diphenhydrAMINE (BENADRYL) capsule 25 mg (25 mg Oral Given 10/23/18 2325)     Initial Impression / Assessment and Plan / ED Course  I have reviewed the triage vital signs and the nursing notes.  Pertinent labs & imaging results that were available during my care of the patient were reviewed by me and considered in my medical decision making (see chart for details).  28 year old male here with urticarial rash.  This began yesterday after eating seafood with a specific seasoning on it.  He eats seafood regularly and has never had any issues.  Has urticarial rash to the face, neck, chest, and somewhat on the arms.  He has no airway involvement, handling secretions well, denies any difficulty swallowing or throat closing.  Patient was started on prednisone but is only taken 1 dose thus far and likely has not had a chance to take any effect yet.  He was given IM Solu-Medrol as well as Benadryl and Pepcid here.  Encouraged to continue the prednisone as well as Benadryl and Pepcid at home.  He can follow-up closely with his primary care doctor.  Return here for any new or acute changes.  Final Clinical Impressions(s) / ED Diagnoses   Final diagnoses:  Rash    ED Discharge Orders    None       Garlon Hatchet, PA-C 10/23/18 2353    Derwood Kaplan, MD 10/25/18 [phone]

## 2018-10-23 NOTE — ED Triage Notes (Signed)
Pt reports "breaking out all over." Hives noted on pt face and hands. He denies any new soaps or laundry detergent. States that symptoms started last night. He was given prednisone at [facility], but states it made it worse. Endorses itching and burning. No SOB or respiratory symptoms. A&Ox4.

## 2018-10-23 NOTE — Discharge Instructions (Addendum)
Continue your prednisone. I would continue benadryl every 6-8 hours for itching.  You can also take some pepcid (the heartburn medication) as this will help augment the benadryl.  This is over the counter. Follow-up with your primary care doctor. Return here for any new/acute changes.

## 2018-11-10 ENCOUNTER — Other Ambulatory Visit: Payer: Self-pay

## 2018-11-10 ENCOUNTER — Ambulatory Visit (INDEPENDENT_AMBULATORY_CARE_PROVIDER_SITE_OTHER): Payer: BLUE CROSS/BLUE SHIELD | Admitting: Infectious Diseases

## 2018-11-10 ENCOUNTER — Encounter: Payer: Self-pay | Admitting: Infectious Diseases

## 2018-11-10 VITALS — BP 130/82 | HR 98 | Temp 98.0°F | Wt 223.0 lb

## 2018-11-10 DIAGNOSIS — T7840XA Allergy, unspecified, initial encounter: Secondary | ICD-10-CM | POA: Insufficient documentation

## 2018-11-10 DIAGNOSIS — Z23 Encounter for immunization: Secondary | ICD-10-CM

## 2018-11-10 DIAGNOSIS — Z0000 Encounter for general adult medical examination without abnormal findings: Secondary | ICD-10-CM | POA: Diagnosis not present

## 2018-11-10 DIAGNOSIS — T7840XD Allergy, unspecified, subsequent encounter: Secondary | ICD-10-CM | POA: Diagnosis not present

## 2018-11-10 DIAGNOSIS — Z21 Asymptomatic human immunodeficiency virus [HIV] infection status: Secondary | ICD-10-CM

## 2018-11-10 NOTE — Assessment & Plan Note (Signed)
Menveo #1 today.  We will have him come back in 2 months for his second dose.  Administer hepatitis B #1 as well at this visit.  Second hep B dose at upcoming office visit with me.

## 2018-11-10 NOTE — Patient Instructions (Addendum)
So nice to see you today.  Please continue taking your Biktarvy once daily as you are now it sounds like you are doing an excellent job getting your medication and regularly.  Your viral load the last time we did blood work was undetectable.  I suspect this is still the case considering you are taking your medication every day.  We will repeat your lab work in 3 months.  I would like to give you your first meningitis vaccine today.  Please come back in 2 months to get your second dose and your first hepatitis B vaccine.  We will see you back for a office visit with Judeth Cornfield in 3 months.  We will do labs at the visit.    I placed a referral for you to see the allergy team.  You should hear from them in the next few weeks.

## 2018-11-10 NOTE — Assessment & Plan Note (Signed)
Darin [name] is doing very well and his medication is working very well to control his HIV.  He has excellent reported adherence and recent viral load less than 20 with CD4 count at intake 770.  We will defer repeating lab work today and update his vaccinations.  He is comfortable with this plan.  We will have him return in 3 months and do lab work at the visit.

## 2018-11-10 NOTE — Assessment & Plan Note (Signed)
She had an allergic reaction to a new food.  This is new.  We will place referral to allergy for evaluation.  Would also ask evaluation for penicillin.  It sounds like he was given a shot of penicillin in the setting of a viral illness which was likely not a true allergic reaction.

## 2018-11-10 NOTE — Addendum Note (Signed)
Addended by: Gerarda Fraction on: 11/10/2018 04:46 PM   Modules accepted: Orders

## 2018-11-10 NOTE — Progress Notes (Signed)
Name: Darin [name]  DOB: 1991/01/21 MRN: [id number] PCP: Darin Plum, MD   Patient Active Problem List   Diagnosis Date Noted  . Allergic reaction 11/10/2018  . Healthcare maintenance 05/23/2018  . HIV (human immunodeficiency virus infection) (HCC) 05/22/2018     Brief Narrative:  Darin [name] is a 28 y.o. male with HIV infection originally dx 04/2018. History of OIs: none. HIV Risk: MSM  Previous Regimens: . naive  Genotypes: . 05-2018: no mutations detected   Subjective:  CC: HIV follow up care.  Allergic reaction to food recently.  Requesting allergy evaluation.  HPI:  Darin [name] is doing well since her last office visit.  He is continue to take his Biktarvy every day as directed.  He recalls only 1 missed dose when he varied his normal daily routine.  He otherwise has had excellent reported adherence, no concern for side effects or access to his medications. No new sexual partners and only the same since her last office visit.  Consistent condom use.  No concern for STI exposure.  He has continued to work but now working from home with [employer].  Staying with his sister and abiding by the CDC recommendations to socially distance and shelter in place.  Interval history noted for allergic reaction and emergency room visit a few weeks ago.  He tells me that he had seafood with new seasoning.  He started breaking out with a rash on his back and noticed that he was intensely itchy.  When he pulled up his shirt he saw welts all over his back.  These have since resolved with treatment he received in the emergency room.  He would like a referral to meet with an allergy team so he can determine what he is allergic to.  Review of Systems  Constitutional: Negative for chills, fever, malaise/fatigue and weight loss.  HENT: Negative for sore throat.        No dental problems  Respiratory: Negative for cough and sputum production.   Cardiovascular: Negative for chest pain and leg swelling.   Gastrointestinal: Negative for abdominal pain, diarrhea and vomiting.  Genitourinary: Negative for dysuria and flank pain.  Musculoskeletal: Negative for joint pain, myalgias and neck pain.  Skin: Negative for rash.  Neurological: Negative for dizziness, tingling and headaches.  Psychiatric/Behavioral: Negative for depression and substance abuse. The patient is not nervous/anxious and does not have insomnia.     Past Medical History:  Diagnosis Date  . HIV infection (HCC)   . Migraines     Outpatient Medications Prior to Visit  Medication Sig Dispense Refill  . aspirin-acetaminophen-caffeine (EXCEDRIN MIGRAINE) 250-250-65 MG tablet Take 2 tablets by mouth every 6 (six) hours as needed for headache.    . bictegravir-emtricitabine-tenofovir AF (BIKTARVY) 50-200-25 MG TABS tablet Take 1 tablet by mouth daily. 30 tablet 3  . Biotin 1 MG CAPS Take by mouth.     No facility-administered medications prior to visit.      Allergies  Allergen Reactions  . Penicillins Hives    As a kid  Has patient had a PCN reaction causing immediate rash, facial/tongue/throat swelling, SOB or lightheadedness with hypotension: Yes Has patient had a PCN reaction causing severe rash involving mucus membranes or skin necrosis: No Has patient had a PCN reaction that required hospitalization: No Has patient had a PCN reaction occurring within the last 10 years: No If all of the above answers are "NO", then may proceed with Cephalosporin use.     Social History  Tobacco Use  . Smoking status: Never Smoker  . Smokeless tobacco: Never Used  Substance Use Topics  . Alcohol use: Yes    Comment: occ  . Drug use: No    Social History   Substance and Sexual Activity  Sexual Activity Yes  . Partners: Male  . Birth control/protection: Condom     Objective:   Vitals:   11/10/18 1539  BP: 130/82  Pulse: 98  Temp: 98 F (36.7 C)  Weight: 223 lb (101.2 kg)   Body mass index is 30.24 kg/m.   Physical Exam Constitutional:      Appearance: He is well-developed.     Comments: Seated comfortably in chair during visit.   HENT:     Mouth/Throat:     Dentition: Normal dentition. No dental abscesses.  Cardiovascular:     Rate and Rhythm: Normal rate and regular rhythm.     Heart sounds: Normal heart sounds.  Pulmonary:     Effort: Pulmonary effort is normal.     Breath sounds: Normal breath sounds.  Abdominal:     General: There is no distension.     Palpations: Abdomen is soft.     Tenderness: There is no abdominal tenderness.  Lymphadenopathy:     Cervical: No cervical adenopathy.  Skin:    General: Skin is warm and dry.     Findings: No rash.  Neurological:     Mental Status: He is alert and oriented to person, place, and time.  Psychiatric:        Judgment: Judgment normal.     Comments: In good spirits today and engaged in care discussion.      Lab Results Lab Results  Component Value Date   WBC 5.7 05/04/2018   HGB 15.3 05/04/2018   HCT 45.5 05/04/2018   MCV 81.8 05/04/2018   PLT 360 05/04/2018    Lab Results  Component Value Date   CREATININE 0.98 05/04/2018   BUN 13 05/04/2018   NA 138 05/04/2018   K 4.3 05/04/2018   CL 105 05/04/2018   CO2 26 05/04/2018    Lab Results  Component Value Date   ALT 31 05/04/2018   AST 24 05/04/2018   ALKPHOS 60 12/08/2016   BILITOT 0.5 05/04/2018    Lab Results  Component Value Date   CHOL 165 05/04/2018   HDL 47 05/04/2018   LDLCALC 104 (H) 05/04/2018   TRIG 53 05/04/2018   CHOLHDL 3.5 05/04/2018   HIV 1 RNA Quant (copies/mL)  Date Value  08/11/2018 <20 DETECTED (A)  05/04/2018 14,100 (H)   CD4 T Cell Abs (/uL)  Date Value  05/04/2018 770     Assessment & Plan:   Problem List Items Addressed This Visit      Unprioritized   Allergic reaction    She had an allergic reaction to a new food.  This is new.  We will place referral to allergy for evaluation.  Would also ask evaluation for  penicillin.  It sounds like he was given a shot of penicillin in the setting of a viral illness which was likely not a true allergic reaction.      Relevant Orders   Ambulatory referral to Allergy   Healthcare maintenance    Menveo #1 today.  We will have him come back in 2 months for his second dose.  Administer hepatitis B #1 as well at this visit.  Second hep B dose at upcoming office visit with me.  HIV (human immunodeficiency virus infection) (HCC) - Primary    Ankush is doing very well and his medication is working very well to control his HIV.  He has excellent reported adherence and recent viral load less than 20 with CD4 count at intake 770.  We will defer repeating lab work today and update his vaccinations.  He is comfortable with this plan.  We will have him return in 3 months and do lab work at the visit.         Rexene Alberts, MSN, NP-C [facility] Pager: [phone] Office: [phone]  11/10/18  4:19 PM

## 2018-11-24 ENCOUNTER — Encounter: Payer: Self-pay | Admitting: Allergy

## 2018-11-24 ENCOUNTER — Other Ambulatory Visit: Payer: Self-pay

## 2018-11-24 ENCOUNTER — Ambulatory Visit (INDEPENDENT_AMBULATORY_CARE_PROVIDER_SITE_OTHER): Payer: BLUE CROSS/BLUE SHIELD | Admitting: Allergy

## 2018-11-24 VITALS — BP 128/82 | HR 75 | Temp 98.0°F | Resp 16 | Ht 70.0 in | Wt 226.6 lb

## 2018-11-24 DIAGNOSIS — T781XXD Other adverse food reactions, not elsewhere classified, subsequent encounter: Secondary | ICD-10-CM | POA: Diagnosis not present

## 2018-11-24 DIAGNOSIS — Z880 Allergy status to penicillin: Secondary | ICD-10-CM

## 2018-11-24 DIAGNOSIS — T781XXA Other adverse food reactions, not elsewhere classified, initial encounter: Secondary | ICD-10-CM | POA: Insufficient documentation

## 2018-11-24 DIAGNOSIS — J3089 Other allergic rhinitis: Secondary | ICD-10-CM | POA: Diagnosis not present

## 2018-11-24 DIAGNOSIS — T7840XD Allergy, unspecified, subsequent encounter: Secondary | ICD-10-CM

## 2018-11-24 MED ORDER — EPINEPHRINE 0.3 MG/0.3ML IJ SOAJ: 0.3000 mg | Freq: Once | INTRAMUSCULAR | 2 refills | Status: AC

## 2018-11-24 MED FILL — BIKTARVY 50-200-25 MG TABS: 50-200-25 | 30 days supply | Qty: 30 | Fill #2

## 2018-11-24 NOTE — Patient Instructions (Addendum)
Today's skin testing showed: Negative to foods and environmental allergies.   Food:  Continue to avoid bell peppers, mushrooms, onions, cherries, cajun seasoning, shellfish. Okay to eat shrimp.  Food allergen skin testing has excellent negative predictive value however there is still a 5% chance that the allergy exists. Therefore, we will investigate further with serum specific IgE levels and, if negative then schedule for open graded oral food challenge. A laboratory order form has been provided for serum specific IgE against certain foods.  Until the food allergy has been definitively ruled out, the patient is to continue meticulous avoidance of the above foods and have access to epinephrine autoinjector 2 pack.  I have prescribed epinephrine injectable and demonstrated proper use. For mild symptoms you can take over the counter antihistamines such as Benadryl and monitor symptoms closely. If symptoms worsen or if you have severe symptoms including breathing issues, throat closure, significant swelling, whole body hives, severe diarrhea and vomiting, lightheadedness then inject epinephrine and seek immediate medical care afterwards.  Food action plan given.   Drug:  Continue to avoid penicillin.  Consider penicillin skin testing and drug challenge in future.   Allergic rhinitis:  May use over the counter antihistamines such as Zyrtec (cetirizine), Claritin (loratadine), Allegra (fexofenadine), or Xyzal (levocetirizine) daily as needed.  Follow up in 6 months

## 2018-11-24 NOTE — Assessment & Plan Note (Signed)
   See assessment and plan as above for allergic reaction.

## 2018-11-24 NOTE — Assessment & Plan Note (Signed)
History of reaction to penicillin as a child.  Continue to avoid penicillin.  Consider penicillin skin testing and drug challenge in future.

## 2018-11-24 NOTE — Progress Notes (Signed)
New Patient Note  RE: Darin [name] MRN: [id number] DOB: 1990/09/14 Date of Office Visit: 11/24/2018  Referring provider: Blanchard Kelch, NP Primary care provider: Jackie Plum, MD  Chief Complaint: Allergy Testing (lobster, crab, shrimp, potatoes, corn, ) and Allergic Reaction (peppers, mushrooms, onions, cherries)  History of Present Illness: I had the pleasure of seeing Darin [name] for initial evaluation at the [facility] on 11/24/2018. He is a 28 y.o. male, who is referred here by Jackie Plum, MD for the evaluation of allergic reaction.   In April 2020, patient was eating a seafood broil which contained, crab, lobster, shrimp and potatoes. This was a takeout order which he ate at his friends house. He finished his meal and about 30-45 minutes noticed some mild pruritus on his back and about 1 hour afterwards he noticed some additional areas of pruritus. He also had some associated hives with this. Denies any swelling, wheezing, abdominal pain, diarrhea, vomiting. He took benadryl at home with some benefit. Denies any associated cofactors such as exertion, infection, NSAID use, or alcohol consumption. He was evaluated in ED and received steroids, benadryl and Pepcid which helped immediately but the symptoms did not completely resolve for 3-5 days. Since this episode, he had shrimp, potatoes, corn with no issues. He does have access to epinephrine autoinjector and not needed to use it.   Patient believes he had a reaction to the Cajun seasoning.   Past work up includes: none. Dietary History: patient has been eating other foods including milk, eggs, peanut, treenuts, sesame, shellfish, seafood, soy, wheat, meats, fruits and vegetables.  Patient has history of reaction to peppers, mushrooms, onions and cherries.  Peppers - broke out in hives in the past from the fresh vegetables but tolerates pepper seasoning.  Mushrooms, onions - broke out in hives.  Cherries - broke out in hives.   10/23/2018 ER visit HPI: "28 y.o. M with hx of HIV, migraines, presenting to the ED for rash.  Patient states this started yesterday after eating seafood with certain type of seasoning.  This was takeout, not food he cooked.  He states he eats seafood all the time, but has not had this particular type of seasoning in over a year since moving to [facility] from [place].  States he has rash on his face, neck, chest, and somewhat on the arms.  He denies any SOB, difficulty swallowing, or sensation of throat closing. He took benadryl yesterday which helped a little.  He got a prescription from urgent care for prednisone and has only taken one dose earlier this afternoon but states now his face is itching and burning.  He has no known allergies aside from penicillin.  No changes in soaps, detergents, shampoo, face wash, or other personal care products."  Assessment and Plan: Darin [name] is a 28 y.o. male with: Allergic reaction Reaction in April 2020 prompting an ER visit after eating a seafood broil. Main symptoms were hives/rash/pruritus. Treated with steroids, benadryl and Pepcid with good benefit. Patient had shrimp, corn and potatoes since then with no issues. Concerned about allergy to cajun seasoning. He has history of hives after eating bell peppers, mushrooms, onions and cherries in the past. No previous work up.   Today's skin testing was negative to foods.  Food allergen skin testing has excellent negative predictive value however there is still a 5% chance that the allergy exists. Therefore, we will investigate further with serum specific IgE levels and, if negative then schedule for  open graded oral food challenge. Get bloodwork as below.    Until the food allergy has been definitively ruled out, the patient is to continue meticulous avoidance of  bell peppers, mushrooms, onions, cherries, cajun seasoning, shellfish. Okay to eat shrimp.  I have prescribed epinephrine injectable  and demonstrated proper use. For mild symptoms you can take over the counter antihistamines such as Benadryl and monitor symptoms closely. If symptoms worsen or if you have severe symptoms including breathing issues, throat closure, significant swelling, whole body hives, severe diarrhea and vomiting, lightheadedness then inject epinephrine and seek immediate medical care afterwards.  Food action plan given.   Adverse food reaction  See assessment and plan as above for allergic reaction.   Other allergic rhinitis Rhino conjunctivitis symptoms in the spring and summer. Takes OTC antihistamines with good benefit.  Today's skin prick testing was negative to environmental allergies.   His symptoms are minimal and decided not to do any further work up at this time.  May use over the counter antihistamines such as Zyrtec (cetirizine), Claritin (loratadine), Allegra (fexofenadine), or Xyzal (levocetirizine) daily as needed.  Penicillin allergy History of reaction to penicillin as a child.  Continue to avoid penicillin.  Consider penicillin skin testing and drug challenge in future.   Return in about 6 months (around 05/27/2019).  Meds ordered this encounter  Medications  . EPINEPHrine (AUVI-Q) 0.3 mg/0.3 mL IJ SOAJ injection    Sig: Inject 0.3 mLs (0.3 mg total) into the muscle once for 1 dose. As directed for life-threatening allergic reactions    Dispense:  4 Device    Refill:  2    Lab Orders     Allergen Profile, Shellfish     Tryptase     Allergen, Mushroom, Rf212     Allergen,Grn Pepper,Paprika,f218     Allergen, Onion, f48     Allergen, Cherry, f242     Allergen, Garlic, f47     Allergen Ara Kussmaul Pepper IgE  Other allergy screening: Asthma: no Rhino conjunctivitis: yes  Some mild itchy eyes, sneezing during the spring and summer. Usually takes OTC medications with good benefit.  Medication allergy: yes  Penicillin - hives Hymenoptera allergy:  Some large localized  reactions and breaks out in hives.  Urticaria: yes Eczema:no History of recurrent infections suggestive of immunodeficency: no  Diagnostics: Skin Testing: Environmental allergy panel and select foods. Negative test to: Environmental allergy panel and select foods.  Results discussed with patient/family. Airborne Adult Perc - 11/24/18 1507    Time Antigen Placed  1507    Allergen Manufacturer  Waynette Buttery    Location  Back    Number of Test  59    1. Control-Buffer 50% Glycerol  Negative    2. Control-Histamine 1 mg/ml  3+    3. Albumin saline  Negative    4. Bahia  Negative    5. French Southern Territ[place]  Negative    6. Johnson  Negative    7. Kentucky Blue  Negative    8. Meadow Fescue  Negative    9. Perennial Rye  Negative    10. Sweet Vernal  Negative    11. Timothy  Negative    12. Cocklebur  Negative    13. Burweed Marshelder  Negative    14. Ragweed, short  Negative    15. Ragweed, Giant  Negative    16. Plantain,  English  Negative    17. Lamb's Quarters  Negative    18. Sheep Sorrell  Negative  19. Rough Pigweed  Negative    20. Marsh Elder, Rough  Negative    21. Mugwort, Common  Negative    22. Ash mix  Negative    23. Birch mix  Negative    24. Beech American  Negative    25. Box, Elder  Negative    26. Cedar, red  Negative    27. Cottonwood, Guinea-BissauEastern  Negative    28. Elm mix  Negative    29. Hickory mix  Negative    30. Maple mix  Negative    31. Oak, Guinea-BissauEastern mix  Negative    32. Pecan Pollen  Negative    33. Pine mix  Negative    34. Sycamore Eastern  Negative    35. Walnut, Black Pollen  Negative    36. Alternaria alternata  Negative    37. Cladosporium Herbarum  Negative    38. Aspergillus mix  Negative    39. Penicillium mix  Negative    40. Bipolaris sorokiniana (Helminthosporium)  Negative    41. Drechslera spicifera (Curvularia)  Negative    42. Mucor plumbeus  Negative    43. Fusarium moniliforme  Negative    44. Aureobasidium pullulans (pullulara)  Negative     45. Rhizopus oryzae  Negative    46. Botrytis cinera  Negative    47. Epicoccum nigrum  Negative    48. Phoma betae  Negative    49. Candida Albicans  Negative    50. Trichophyton mentagrophytes  Negative    51. Mite, D Farinae  5,000 AU/ml  Negative    52. Mite, D Pteronyssinus  5,000 AU/ml  Negative    53. Cat Hair 10,000 BAU/ml  Negative    54.  Dog Epithelia  Negative    55. Mixed Feathers  Negative    56. Horse Epithelia  Negative    57. Cockroach, German  Negative    58. Mouse  Negative    59. Tobacco Leaf  Negative     Food Adult Perc - 11/24/18 1500    Time Antigen Placed  1500    Allergen Manufacturer  Greer    Location  Back    Number of allergen test  28     Control-buffer 50% Glycerol  Negative    Control-Histamine 1 mg/ml  3+    1. Peanut  Negative    2. Soybean  Negative    3. Wheat  Negative    4. Sesame  Negative    5. Milk, cow  Negative    6. Egg White, Chicken  Negative    7. Casein  Negative    8. Shellfish Mix  Negative    9. Fish Mix  Negative    10. Cashew  Negative    18. Catfish  Negative    19. Bass  Negative    20. Trout  Negative    21. Tuna  Negative    22. Salmon  Negative    23. Flounder  Negative    24. Codfish  Negative    25. Shrimp  Negative    26. Crab  Negative    27. Lobster  Negative    28. Oyster  Negative    29. Scallops  Negative    43. White Potato  Negative    47. Mushrooms  Negative    49. Onion  Negative    53. Corn  Negative    70. Garlic  Negative    71. Pepper, black  Negative    1. Hamster  Omitted    2. Israel Pig  Omitted    3. Rabbit  Omitted    4. Gerbil  Omitted    5. Rat  Omitted    6. Other  Omitted    7. Other  Omitted    8. Other  Omitted       Past Medical History: Patient Active Problem List   Diagnosis Date Noted  . Other allergic rhinitis 11/24/2018  . Penicillin allergy 11/24/2018  . Adverse food reaction 11/24/2018  . Allergic reaction 11/10/2018  . Healthcare maintenance 05/23/2018   . HIV (human immunodeficiency virus infection) (HCC) 05/22/2018   Past Medical History:  Diagnosis Date  . HIV infection (HCC)   . Migraines    Past Surgical History: Past Surgical History:  Procedure Laterality Date  . NO PAST SURGERIES     Medication List:  Current Outpatient Medications  Medication Sig Dispense Refill  . aspirin-acetaminophen-caffeine (EXCEDRIN MIGRAINE) 250-250-65 MG tablet Take 2 tablets by mouth every 6 (six) hours as needed for headache.    . bictegravir-emtricitabine-tenofovir AF (BIKTARVY) 50-200-25 MG TABS tablet Take 1 tablet by mouth daily. 30 tablet 3  . Biotin 1 MG CAPS Take by mouth.    . EPINEPHrine (AUVI-Q) 0.3 mg/0.3 mL IJ SOAJ injection Inject 0.3 mLs (0.3 mg total) into the muscle once for 1 dose. As directed for life-threatening allergic reactions 4 Device 2   No current facility-administered medications for this visit.    Allergies: Allergies  Allergen Reactions  . Penicillins Hives    As a kid  Has patient had a PCN reaction causing immediate rash, facial/tongue/throat swelling, SOB or lightheadedness with hypotension: Yes Has patient had a PCN reaction causing severe rash involving mucus membranes or skin necrosis: No Has patient had a PCN reaction that required hospitalization: No Has patient had a PCN reaction occurring within the last 10 years: No If all of the above answers are "NO", then may proceed with Cephalosporin use.    Social History: Social History   Socioeconomic History  . Marital status: Single    Spouse name: Not on file  . Number of children: Not on file  . Years of education: Not on file  . Highest education level: Not on file  Occupational History  . Not on file  Social Needs  . Financial resource strain: Not on file  . Food insecurity:    Worry: Not on file    Inability: Not on file  . Transportation needs:    Medical: Not on file    Non-medical: Not on file  Tobacco Use  . Smoking status: Passive  Smoke Exposure - Never Smoker  . Smokeless tobacco: Never Used  . Tobacco comment: sister smokes inside the house  Substance and Sexual Activity  . Alcohol use: Yes    Comment: occ  . Drug use: No  . Sexual activity: Yes    Partners: Male    Birth control/protection: Condom  Lifestyle  . Physical activity:    Days per week: Not on file    Minutes per session: Not on file  . Stress: Not on file  Relationships  . Social connections:    Talks on phone: Not on file    Gets together: Not on file    Attends religious service: Not on file    Active member of club or organization: Not on file    Attends meetings of clubs or organizations: Not on file  Relationship status: Not on file  Other Topics Concern  . Not on file  Social History Narrative  . Not on file   Lives in an apartment. Smoking: no Occupation: Press photographer History: Water Damage/mildew in the house: no Carpet in the family room: no Carpet in the bedroom: yes Heating: electric Cooling: central Pet: yes 1 dog x  months  Family History: Family History  Problem Relation Age of Onset  . Migraines Mother   . Healthy Mother   . Migraines Father   . Healthy Father   . Healthy Sister   . AAA (abdominal aortic aneurysm) Maternal Grandfather   . Hypertension Maternal Grandfather   . Diabetes Paternal Grandmother   . Cancer Paternal Grandmother   . Healthy Brother   . Healthy Brother    Problem                               Relation Asthma                                   No  Eczema                                Mother  Food allergy                          No  Allergic rhino conjunctivitis     Mother, brother   Review of Systems  Constitutional: Negative for appetite change, chills, fever and unexpected weight change.  HENT: Negative for congestion and rhinorrhea.   Eyes: Negative for itching.  Respiratory: Negative for cough, chest tightness, shortness of breath and  wheezing.   Cardiovascular: Negative for chest pain.  Gastrointestinal: Negative for abdominal pain.  Genitourinary: Negative for difficulty urinating.  Skin: Negative for rash.  Allergic/Immunologic: Positive for food allergies.  Neurological: Positive for headaches.   Objective: BP 128/82 (BP Location: Left Arm, Patient Position: Sitting, Cuff Size: Normal)   Pulse 75   Temp 98 F (36.7 C) (Temporal)   Resp 16   Ht 5\' 10"  (1.778 m)   Wt 226 lb 9.6 oz (102.8 kg)   SpO2 95%   BMI 32.51 kg/m  Body mass index is 32.51 kg/m. Physical Exam  Constitutional: He is oriented to person, place, and time. He appears well-developed and well-nourished.  HENT:  Head: Normocephalic and atraumatic.  Left Ear: External ear normal.  Nose: Nose normal.  Mouth/Throat: Oropharynx is clear and moist.  Right ear cerumen impaction  Eyes: Conjunctivae and EOM are normal.  Neck: Neck supple.  Cardiovascular: Normal rate, regular rhythm and normal heart sounds. Exam reveals no gallop and no friction rub.  No murmur heard. Pulmonary/Chest: Effort normal and breath sounds normal. He has no wheezes. He has no rales.  Abdominal: Soft.  Neurological: He is alert and oriented to person, place, and time.  Skin: Skin is warm. No rash noted.  Psychiatric: He has a normal mood and affect. His behavior is normal.  Nursing note and vitals reviewed.  The plan was reviewed with the patient/family, and all questions/concerned were addressed.  It was my pleasure to see Darin [name] today and participate in his care. Please feel free to contact me with any questions or concerns.  Sincerely,  Rexene Alberts, DO Allergy & Immunology  [facility] office: [phone] [facility] office: [phone]

## 2018-11-24 NOTE — Assessment & Plan Note (Signed)
Reaction in April 2020 prompting an ER visit after eating a seafood broil. Main symptoms were hives/rash/pruritus. Treated with steroids, benadryl and Pepcid with good benefit. Patient had shrimp, corn and potatoes since then with no issues. Concerned about allergy to cajun seasoning. He has history of hives after eating bell peppers, mushrooms, onions and cherries in the past. No previous work up.   Today's skin testing was negative to foods.  Food allergen skin testing has excellent negative predictive value however there is still a 5% chance that the allergy exists. Therefore, we will investigate further with serum specific IgE levels and, if negative then schedule for open graded oral food challenge. Get bloodwork as below.    Until the food allergy has been definitively ruled out, the patient is to continue meticulous avoidance of  bell peppers, mushrooms, onions, cherries, cajun seasoning, shellfish. Okay to eat shrimp.  I have prescribed epinephrine injectable and demonstrated proper use. For mild symptoms you can take over the counter antihistamines such as Benadryl and monitor symptoms closely. If symptoms worsen or if you have severe symptoms including breathing issues, throat closure, significant swelling, whole body hives, severe diarrhea and vomiting, lightheadedness then inject epinephrine and seek immediate medical care afterwards.  Food action plan given.

## 2018-11-24 NOTE — Assessment & Plan Note (Signed)
Rhino conjunctivitis symptoms in the spring and summer. Takes OTC antihistamines with good benefit.  Today's skin prick testing was negative to environmental allergies.   His symptoms are minimal and decided not to do any further work up at this time.  May use over the counter antihistamines such as Zyrtec (cetirizine), Claritin (loratadine), Allegra (fexofenadine), or Xyzal (levocetirizine) daily as needed.

## 2018-11-26 LAB — ALLERGEN PROFILE, SHELLFISH
Clam IgE: <0.1 kU/L
F023-IgE Crab: <0.1 kU/L
F080-IgE Lobster: <0.1 kU/L
F290-IgE Oyster: <0.1 kU/L
Scallop IgE: <0.1 kU/L
Shrimp IgE: <0.1 kU/L

## 2018-11-26 LAB — ALLERGEN, MUSHROOM, RF212: Mushroom IgE: <0.1 kU/L

## 2018-11-26 LAB — TRYPTASE: Tryptase: 2.9 ug/L (ref 2.2–13.2)

## 2018-11-26 LAB — ALLERGEN, CHERRY, F242: F242-IgE Bing Cherry: <0.1 kU/L

## 2018-11-26 LAB — ALLERGEN,GRN PEPPER,PAPRIKA,F218: Paprika IgE: <0.1 kU/L

## 2018-11-26 LAB — ALLERGEN, ONION, F48: Allergen Onion IgE: <0.1 kU/L

## 2018-11-26 LAB — ALLERGEN, GARLIC, F47: Allergen Garlic IgE: <0.1 kU/L

## 2018-11-26 LAB — F263-IGE GREEN BELL PEPPER: Allergen Green Bell Pepper IgE: <0.1 kU/L

## 2018-11-30 ENCOUNTER — Encounter: Payer: Self-pay | Admitting: Allergy

## 2018-12-26 ENCOUNTER — Telehealth: Payer: Self-pay | Admitting: Allergy

## 2018-12-26 MED ORDER — AMOXICILLIN 500 MG PO TABS: ORAL_TABLET | ORAL | 0 refills | Status: DC

## 2018-12-26 NOTE — Telephone Encounter (Signed)
Please call patient.  I sent in amoxicillin pills to be picked up for the drug challenge.  No antihistamines for 3 days before challenge. Must be feeling well with no fevers or chills.   Will do skin testing and intradermal first. If negative will do oral drug challenge on the same day. Plan on being here for 2-3 hours.

## 2018-12-27 NOTE — Telephone Encounter (Signed)
Left detailed message advised of plan

## 2018-12-28 NOTE — Telephone Encounter (Signed)
Called and left voice message to return call

## 2018-12-29 ENCOUNTER — Encounter: Payer: BLUE CROSS/BLUE SHIELD | Admitting: Allergy

## 2018-12-29 MED FILL — BIKTARVY 50-200-25 MG TABS: 50-200-25 | 30 days supply | Qty: 30 | Fill #3

## 2018-12-29 NOTE — Progress Notes (Deleted)
Follow Up Note  RE: Darin [name] MRN: [id number] DOB: [birth year] Date of Office Visit: 12/29/2018  Referring provider: Jackie Plumsei-Bonsu, George, MD Primary care provider: Jackie Plumsei-Bonsu, George, MD  Chief Complaint: No chief complaint on file.  Assessment and Plan: Darin [name] is a 28 y.o. male with: No problem-specific Assessment & Plan notes found for this encounter.  No follow-ups on file.  Plan: Challenge drug: penicillin Challenge as per protocol: {Blank single:19197::"Passed","Failed"} Total time: ***  For next 24 hours monitor for hives, swelling, shortness of breath and dizziness. If you see these symptoms, use Benadryl for mild symptoms and epinephrine for more severe symptoms and call 911.  If no adverse symptoms in the next 24 hours then will take off drug from allergy list.  History of Present Illness: I had the pleasure of seeing Darin [name] for a follow up visit at the [facility] on 12/29/2018. He is a 28 y.o. male, who is being followed for allergic reactions, food allergy, allergic rhinitis. Today he is here for penicillin drug testing and challenge. He is accompanied today by his *** who provided/contributed to the history. His previous allergy office visit was on 11/24/2018.   Chief Complaint: Challenge testing to ***.  History of Reaction: History of reaction to penicillin as a child.  Continue to avoid penicillin.  Consider penicillin skin testing and drug challenge in future  Allergic reaction Reaction in April 2020 prompting an ER visit after eating a seafood broil. Main symptoms were hives/rash/pruritus. Treated with steroids, benadryl and Pepcid with good benefit. Patient had shrimp, corn and potatoes since then with no issues. Concerned about allergy to cajun seasoning. He has history of hives after eating bell peppers, mushrooms, onions and cherries in the past. No previous work up.   Today's skin testing was negative to foods.   Food allergen  skin testing has excellent negative predictive value however there is still a 5% chance that the allergy exists. Therefore, we will investigate further with serum specific IgE levels and, if negative then schedule for open graded oral food challenge.  Get bloodwork as below.    Until the food allergy has been definitively ruled out, the patient is to continue meticulous avoidance of  bell peppers, mushrooms, onions, cherries, cajun seasoning, shellfish. Okay to eat shrimp.  I have prescribed epinephrine injectable and demonstrated proper use. For mild symptoms you can take over the counter antihistamines such as Benadryl and monitor symptoms closely. If symptoms worsen or if you have severe symptoms including breathing issues, throat closure, significant swelling, whole body hives, severe diarrhea and vomiting, lightheadedness then inject epinephrine and seek immediate medical care afterwards.  Food action plan given.   Adverse food reaction  See assessment and plan as above for allergic reaction.   Other allergic rhinitis Rhino conjunctivitis symptoms in the spring and summer. Takes OTC antihistamines with good benefit.  Today's skin prick testing was negative to environmental allergies.   His symptoms are minimal and decided not to do any further work up at this time.  May use over the counter antihistamines such as Zyrtec (cetirizine), Claritin (loratadine), Allegra (fexofenadine), or Xyzal (levocetirizine) daily as needed  Labs: *** Interval History: Patient has not been ill, he has not had any accidental exposures to the culprit medication.   Recent/Current History: Pulmonary disease: {Blank single:19197::"yes","no"} Cardiac disease: {Blank single:19197::"yes","no"} Respiratory infection: {Blank single:19197::"yes","no"} Rash: {Blank single:19197::"yes","no"} Itch: {Blank single:19197::"yes","no"} Swelling: {Blank single:19197::"yes","no"} Cough: {Blank  single:19197::"yes","no"} Shortness of breath: {Blank single:19197::"yes","no"} Runny/stuffy nose: {  Blank single:19197::"yes","no"} Itchy eyes: {Blank single:19197::"yes","no"} Beta-blocker use: {Blank single:19197::"yes","no"}  Patient/guardian was informed of the test procedure with verbalized understanding of the risk of anaphylaxis.   Last antihistamine use: *** Last beta-blocker use: ***  Medication List:  Current Outpatient Medications  Medication Sig Dispense Refill  . amoxicillin (AMOXIL) 500 MG tablet DO NOT TAKE AT HOME. PLEASE bring to Dr. Elmyra RicksKim's office for drug challenge. 2 tablet 0  . aspirin-acetaminophen-caffeine (EXCEDRIN MIGRAINE) 250-250-65 MG tablet Take 2 tablets by mouth every 6 (six) hours as needed for headache.    . bictegravir-emtricitabine-tenofovir AF (BIKTARVY) 50-200-25 MG TABS tablet Take 1 tablet by mouth daily. 30 tablet 3  . Biotin 1 MG CAPS Take by mouth.     No current facility-administered medications for this visit.    Allergies: Allergies  Allergen Reactions  . Penicillins Hives    As a kid  Has patient had a PCN reaction causing immediate rash, facial/tongue/throat swelling, SOB or lightheadedness with hypotension: Yes Has patient had a PCN reaction causing severe rash involving mucus membranes or skin necrosis: No Has patient had a PCN reaction that required hospitalization: No Has patient had a PCN reaction occurring within the last 10 years: No If all of the above answers are "NO", then may proceed with Cephalosporin use.    I reviewed his past medical history, social history, family history, and environmental history and no significant changes have been reported from previous visit on 11/24/2018.  Review of Systems  Constitutional: Negative for appetite change, chills, fever and unexpected weight change.  HENT: Negative for congestion and rhinorrhea.   Eyes: Negative for itching.  Respiratory: Negative for cough, chest tightness,  shortness of breath and wheezing.   Cardiovascular: Negative for chest pain.  Gastrointestinal: Negative for abdominal pain.  Genitourinary: Negative for difficulty urinating.  Skin: Negative for rash.  Allergic/Immunologic: Positive for food allergies.  Neurological: Positive for headaches.   Objective: There were no vitals taken for this visit. There is no height or weight on file to calculate BMI. Physical Exam  Constitutional: He is oriented to person, place, and time. He appears well-developed and well-nourished.  HENT:  Head: Normocephalic and atraumatic.  Left Ear: External ear normal.  Nose: Nose normal.  Mouth/Throat: Oropharynx is clear and moist.  Right ear cerumen impaction  Eyes: Conjunctivae and EOM are normal.  Neck: Neck supple.  Cardiovascular: Normal rate, regular rhythm and normal heart sounds. Exam reveals no gallop and no friction rub.  No murmur heard. Pulmonary/Chest: Effort normal and breath sounds normal. He has no wheezes. He has no rales.  Abdominal: Soft.  Neurological: He is alert and oriented to person, place, and time.  Skin: Skin is warm. No rash noted.  Psychiatric: He has a normal mood and affect. His behavior is normal.  Nursing note and vitals reviewed.   Diagnostics: Spirometry:  Tracings reviewed. His effort: ***. {Blank single:19197::"results normal (FEV1: ***%, FVC: ***%, FEV1/FVC: ***%)","results abnormal (FEV1: ***%, FVC: ***%, FEV1/FVC: ***%)"}.  Interpretation: {Blank single:19197::"Spirometry consistent with mild obstructive disease","Spirometry consistent with moderate obstructive disease","Spirometry consistent with severe obstructive disease","Spirometry consistent with possible restrictive disease","Spirometry consistent with mixed obstructive and restrictive disease","Spirometry uninterpretable due to technique","Spirometry consistent with normal pattern"}.  {Blank single:19197::"Albuterol/Atrovent nebulizer","Xopenex/Atrovent  nebulizer","Albuterol nebulizer"} treatment given in clinic with {Blank single:19197::"significant improvement in FEV1 per ATS criteria","significant improvement in FVC per ATS criteria","significant improvement in FEV1 and FVC per ATS criteria","improvement in FEV1, but not significant per ATS criteria","improvement in FVC, but not significant per ATS criteria","improvement  in FEV1 and FVC, but not significant per ATS criteria","no improvement"}. Results discussed with patient/family.  Skin Testing: {Blank single:19197::"None","Deferred due to recent antihistamines use"}. Positive test to: ***. Negative test to: ***.  Results discussed with patient/family.   Previous notes and tests were reviewed. The plan was reviewed with the patient/family, and all questions/concerned were addressed.  It was my pleasure to see Samaad today and participate in his care. Please feel free to contact me with any questions or concerns.  Sincerely,  Rexene Alberts, DO Allergy & Immunology  [facility] office: [phone] [place]

## 2019-01-10 ENCOUNTER — Ambulatory Visit (INDEPENDENT_AMBULATORY_CARE_PROVIDER_SITE_OTHER): Payer: BC Managed Care – PPO | Admitting: *Deleted

## 2019-01-10 ENCOUNTER — Other Ambulatory Visit: Payer: Self-pay

## 2019-01-10 DIAGNOSIS — Z23 Encounter for immunization: Secondary | ICD-10-CM

## 2019-01-26 ENCOUNTER — Other Ambulatory Visit: Payer: Self-pay | Admitting: Infectious Diseases

## 2019-01-26 DIAGNOSIS — B20 Human immunodeficiency virus [HIV] disease: Secondary | ICD-10-CM

## 2019-01-28 IMAGING — CT CT ABD-PELV W/ CM
2 of 4 series · 16 of 46 positions shown, 18 images · IV contrast (ISOVUE)
Comparison: None.

CLINICAL DATA: Upper abdominal pain for several days worse over the
past 2 days with diarrhea.

EXAM:
CT ABDOMEN AND PELVIS WITH CONTRAST
TECHNIQUE: Multidetector CT imaging of the abdomen and pelvis was performed
using the standard protocol following bolus administration of
intravenous contrast.
CONTRAST:  100mL YBP3H6-X33 IOPAMIDOL (YBP3H6-X33) INJECTION 61%

[Series 2: abd/pel with · axial · 0.74mm/px · z∈[+1236,+1626]mm · 13 of 88 slices shown, 15 images]
[im 5/88  soft-tissue]
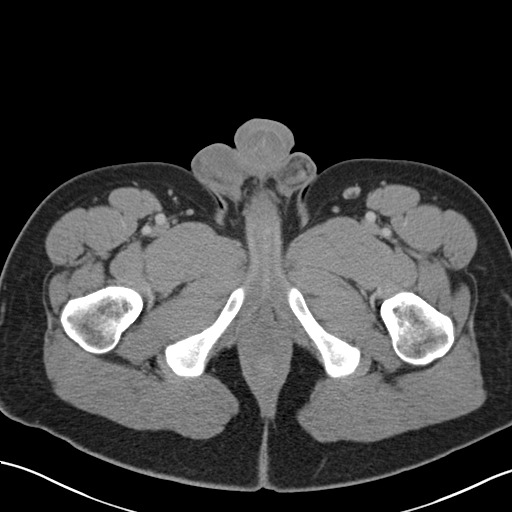
[im 5/88  bone]
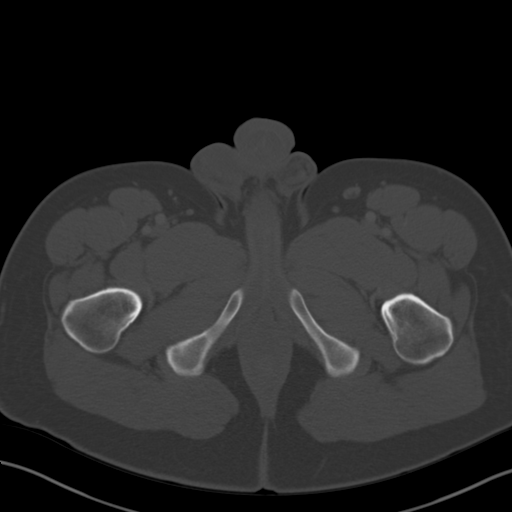
[im 14/88  soft-tissue]
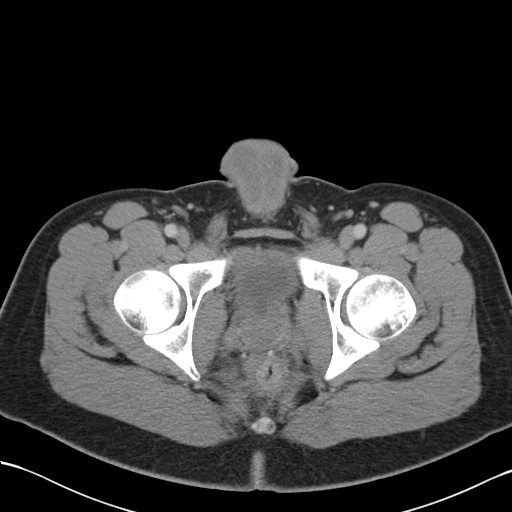
[im 18/88  soft-tissue]
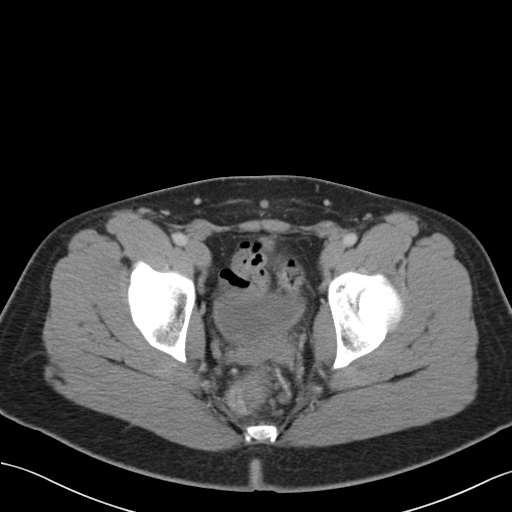
[im 27/88  soft-tissue]
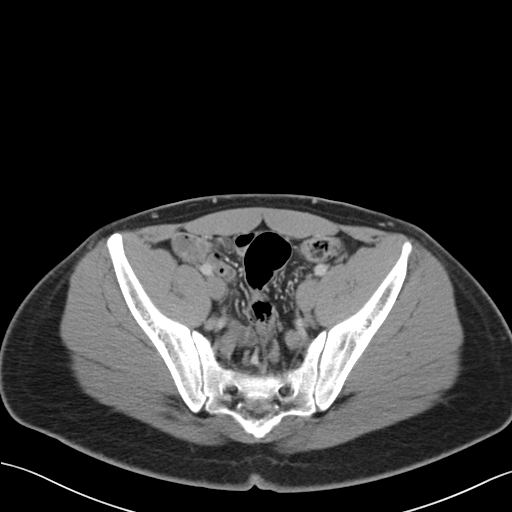
[im 31/88  soft-tissue]
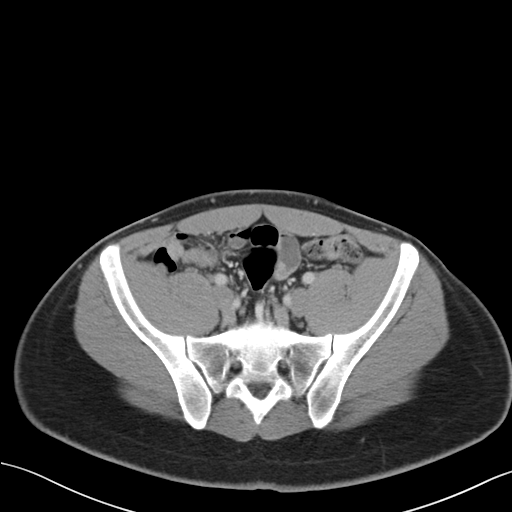
[im 40/88  soft-tissue]
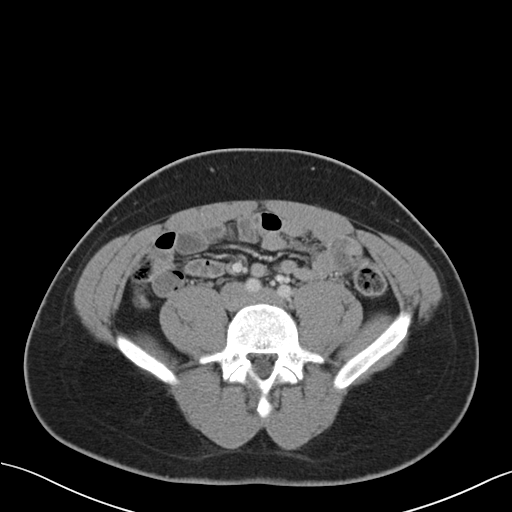
[im 44/88  soft-tissue]
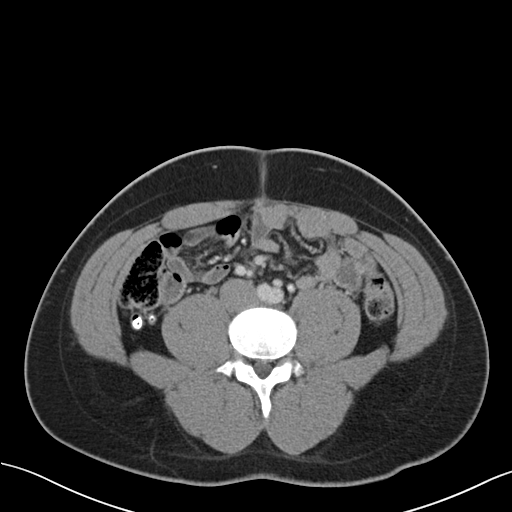
[im 48/88  soft-tissue]
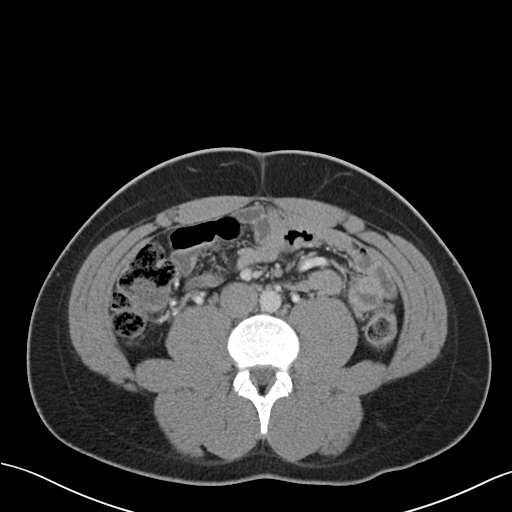
[im 57/88  soft-tissue]
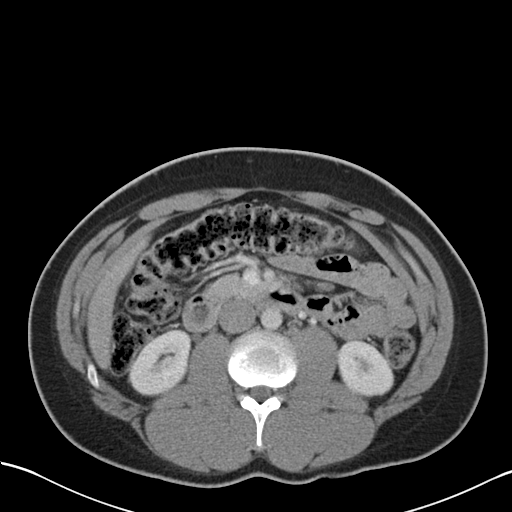
[im 57/88  bone]
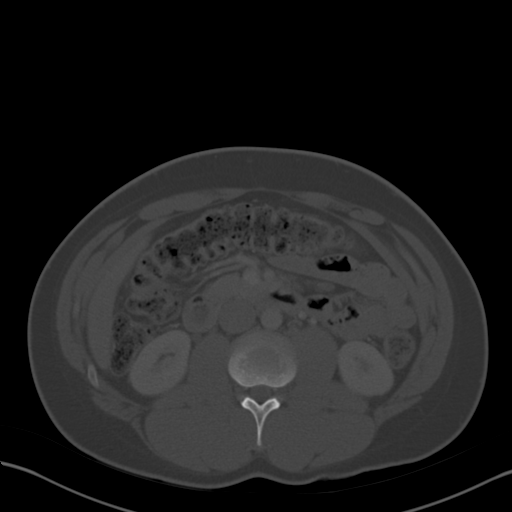
[im 61/88  soft-tissue]
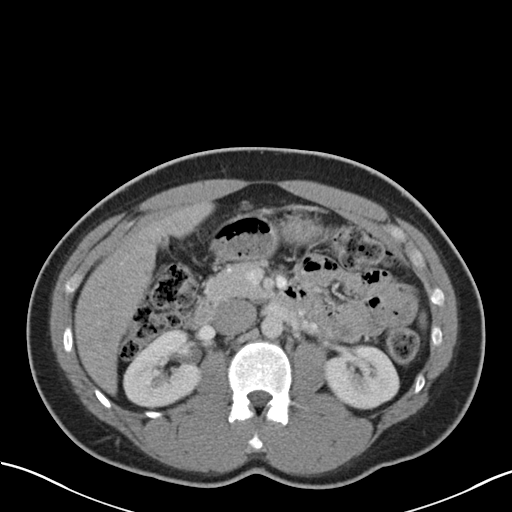
[im 70/88  soft-tissue]
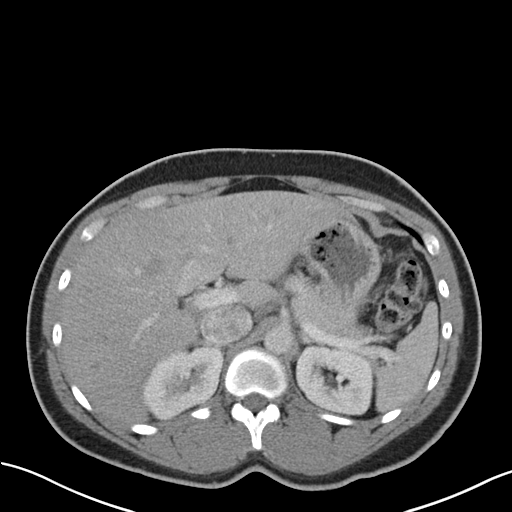
[im 74/88  soft-tissue]
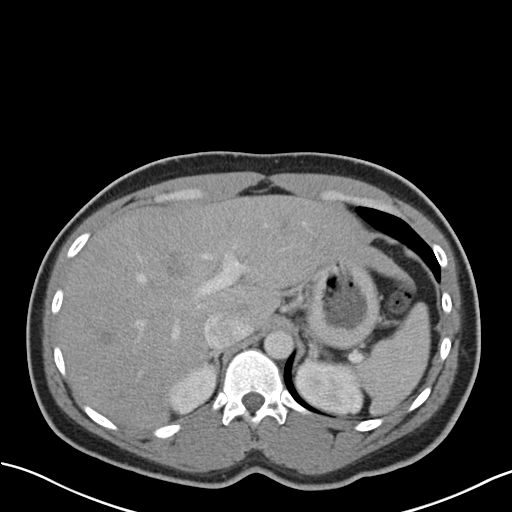
[im 83/88  soft-tissue]
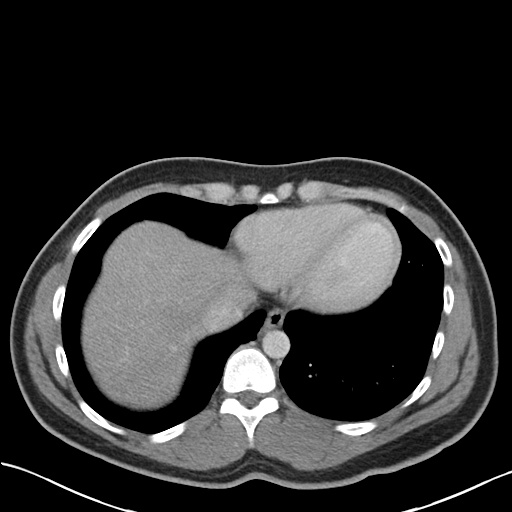

[Series 4: coronal a/|p · coronal · 0.62mm/px · 3 of 118 slices shown]
[im 40/118  soft-tissue]
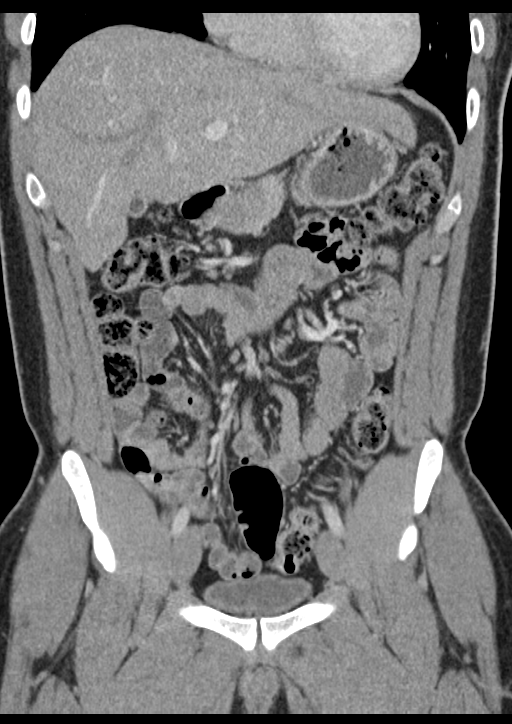
[im 53/118  soft-tissue]
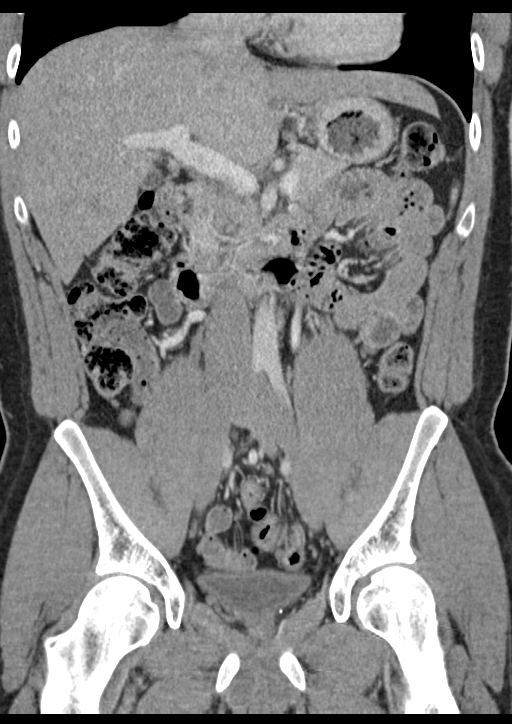
[im 66/118  soft-tissue]
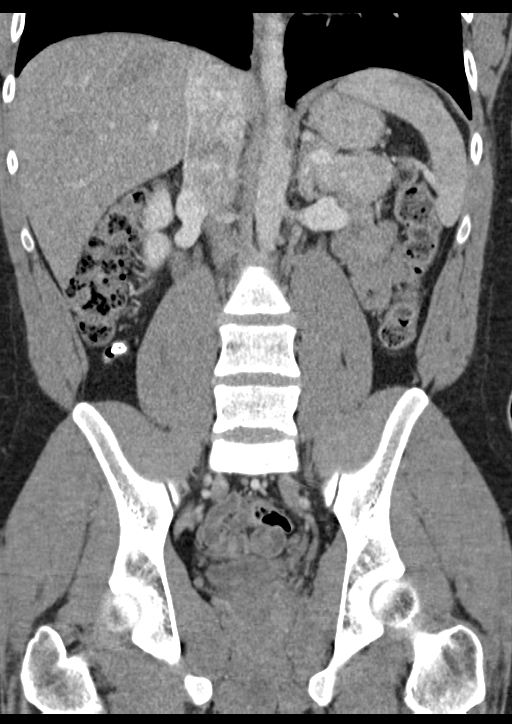

[16 of 46 positions shown; findings below may reference images not displayed]

FINDINGS: Lower chest: No acute abnormality.

Hepatobiliary: Homogeneous attenuation of the liver without mass or
biliary dilatation. Contracted gallbladder without stones.

Pancreas: Normal

Spleen: Normal

Adrenals/Urinary Tract: Normal

Stomach/Bowel: Physiologically distended stomach. There is mild
thickening of the duodenum along its second and third portions which
may represent changes of duodenitis. No bowel obstruction is seen.
There is increased colonic stool burden throughout large bowel. The
appendix is normal.

Vascular/Lymphatic: No significant vascular findings are present. No
enlarged abdominal or pelvic lymph nodes.

Reproductive: Prostate is unremarkable.

Other: No abdominal wall hernia or abnormality. No abdominopelvic
ascites.

Musculoskeletal: No acute or significant osseous findings.
IMPRESSION: 1. Slight thickened appearance of the duodenum raising the
possibility of a duodenitis. This may explain the patient's upper
abdominal pain. No mechanical source of bowel obstruction.
2. No acute solid organ pathology.

## 2019-01-30 MED FILL — BIKTARVY 50-200-25 MG TABS: 50-200-25 | 30 days supply | Qty: 30 | Fill #0

## 2019-02-21 ENCOUNTER — Ambulatory Visit (INDEPENDENT_AMBULATORY_CARE_PROVIDER_SITE_OTHER): Payer: BC Managed Care – PPO | Admitting: Infectious Diseases

## 2019-02-21 ENCOUNTER — Other Ambulatory Visit: Payer: Self-pay

## 2019-02-21 DIAGNOSIS — Z23 Encounter for immunization: Secondary | ICD-10-CM | POA: Diagnosis not present

## 2019-02-21 DIAGNOSIS — Z21 Asymptomatic human immunodeficiency virus [HIV] infection status: Secondary | ICD-10-CM | POA: Diagnosis not present

## 2019-02-21 DIAGNOSIS — Z0000 Encounter for general adult medical examination without abnormal findings: Secondary | ICD-10-CM | POA: Diagnosis not present

## 2019-02-21 NOTE — Patient Instructions (Signed)
Nice to see you!  Please continue taking your biktarvy every day as you are now. Once your labs are back will release results for you to your MyChart.   We gave you your 1st hepatitis b vaccine today. Please come back for a nurse visit in 2 months to get your next shot.   Will see you back in the office in 6 months. Good luck with your semester and have a good holiday.

## 2019-02-21 NOTE — Progress Notes (Signed)
Name: Darin [name]  DOB: [birth year] MRN: [id number] PCP: Darin Mccreedy, MD    Patient Active Problem List   Diagnosis Date Noted  . Other allergic rhinitis 11/24/2018  . Penicillin allergy 11/24/2018  . Adverse food reaction 11/24/2018  . Allergic reaction 11/10/2018  . Healthcare maintenance 05/23/2018  . HIV (human immunodeficiency virus infection) ([place]) 05/22/2018     Brief Narrative:  Darin [name] is a 28 y.o. male with HIV, dx 04/2018.  History of OIs: none.  HIV Risk: MSM  Previous Regimens: . Biktarvy >> VL <20 copies   Genotypes: . 05-2018: no mutations detected   Subjective:  CC: HIV follow up care. No concerns or complaints.    HPI:  Darin [name]. He continues to take his Biktarvy every day without any missed doses. He has no side effects or concerns with access to his medications. He is working full time presently. No new sexual partners or encounters since our last office visit together. He denies any concern for STI exposure/symptoms. He reports no concerns over depression or anxiety. He is sleeping and eating [name].   No changes to medical history, no significant illnesses or hospitalization.   Review of Systems  Constitutional: Negative for chills, fever, malaise/fatigue and weight loss.  HENT: Negative for sore throat.        No dental problems  Respiratory: Negative for cough and sputum production.   Cardiovascular: Negative for chest pain and leg swelling.  Gastrointestinal: Negative for abdominal pain, diarrhea and vomiting.  Genitourinary: Negative for dysuria and flank pain.  Musculoskeletal: Negative for joint pain, myalgias and neck pain.  Skin: Negative for rash.  Neurological: Negative for dizziness, tingling and headaches.  Psychiatric/Behavioral: Negative for depression and substance abuse. The patient is not nervous/anxious and does not have insomnia.     Past Medical History:  Diagnosis Date  . HIV infection ([place])   .  Migraines     Outpatient Medications Prior to Visit  Medication Sig Dispense Refill  . aspirin-acetaminophen-caffeine (EXCEDRIN MIGRAINE) 250-250-65 MG tablet Take 2 tablets by mouth every 6 (six) hours as needed for headache.    Marland Kitchen BIKTARVY 50-200-25 MG TABS tablet TAKE 1 TABLET BY MOUTH DAILY. 30 tablet 5  . Biotin 1 MG CAPS Take by mouth.    Marland Kitchen amoxicillin (AMOXIL) 500 MG tablet DO NOT TAKE AT HOME. PLEASE bring to Dr. Julianne [name] office for drug challenge. 2 tablet 0   No facility-administered medications prior to visit.      Allergies  Allergen Reactions  . Penicillins Hives    As a kid  Has patient had a PCN reaction causing immediate rash, facial/tongue/throat swelling, SOB or lightheadedness with hypotension: Yes Has patient had a PCN reaction causing severe rash involving mucus membranes or skin necrosis: No Has patient had a PCN reaction that required hospitalization: No Has patient had a PCN reaction occurring within the last 10 years: No If all of the above answers are "NO", then may proceed with Cephalosporin use.     Social History   Tobacco Use  . Smoking status: Passive Smoke Exposure - Never Smoker  . Smokeless tobacco: Never Used  . Tobacco comment: sister smokes inside the house  Substance Use Topics  . Alcohol use: Yes    Comment: occ  . Drug use: No    Social History   Substance and Sexual Activity  Sexual Activity Yes  . Partners: Male  . Birth control/protection: Condom     Objective:  There were no vitals filed for this visit. There is no height or weight on file to calculate BMI.  Physical Exam Constitutional:      Appearance: He is [name]-developed.     Comments: Seated comfortably in chair during visit.   HENT:     Mouth/Throat:     Dentition: Normal dentition. No dental abscesses.  Cardiovascular:     Rate and Rhythm: Normal rate and regular rhythm.     Heart sounds: Normal heart sounds.  Pulmonary:     Effort: Pulmonary effort is  normal.     Breath sounds: Normal breath sounds.  Abdominal:     General: There is no distension.     Palpations: Abdomen is soft.     Tenderness: There is no abdominal tenderness.  Lymphadenopathy:     Cervical: No cervical adenopathy.  Skin:    General: Skin is warm and dry.     Findings: No rash.  Neurological:     Mental Status: He is alert and oriented to person, place, and time.  Psychiatric:        Judgment: Judgment normal.     Comments: In good spirits today and engaged in care discussion.      Lab Results Lab Results  Component Value Date   WBC 7.6 02/21/2019   HGB 15.5 02/21/2019   HCT 46.4 02/21/2019   MCV 85.1 02/21/2019   PLT 345 02/21/2019    Lab Results  Component Value Date   CREATININE 1.07 02/21/2019   BUN 11 02/21/2019   NA 138 02/21/2019   K 4.5 02/21/2019   CL 106 02/21/2019   CO2 23 02/21/2019    Lab Results  Component Value Date   ALT 42 02/21/2019   AST 23 02/21/2019   ALKPHOS 60 12/08/2016   BILITOT 0.3 02/21/2019    Lab Results  Component Value Date   CHOL 165 05/04/2018   HDL 47 05/04/2018   LDLCALC 104 (H) 05/04/2018   TRIG 53 05/04/2018   CHOLHDL 3.5 05/04/2018   HIV 1 RNA Quant (copies/mL)  Date Value  08/11/2018 <20 DETECTED (A)  05/04/2018 14,100 (H)   CD4 T Cell Abs (/uL)  Date Value  02/21/2019 1,013  05/04/2018 770     Assessment & Plan:   Problem List Items Addressed This Visit      Unprioritized   HIV (human immunodeficiency virus infection) (H[facility]) - Primary (Chronic)    Darin [name] continues to do very [name] with his HIV treatment. Will update his HIV lab work today and continue his Biktarvy and have him return in 6 months.  Vaccines updated.  Condoms/STI screen declined.   Return in about 6 months (around 08/24/2019).       Relevant Orders   COMPLETE METABOLIC PANEL WITH GFR (Completed)   CBC with Differential/Platelet (Completed)   T-helper cell (CD4)- ([facility] clinic only) (Completed)   HIV-1 RNA quant-no  reflex-bld   Darin [name] (Completed)   Healthcare maintenance    First Hep B today; second dose in 8 weeks. Will assess antibody with next lab draw.  Flu shot in the fall before November.        Other Visit Diagnoses    Need for hepatitis B vaccination       Relevant Orders   Darin [name] (Completed)     Rexene AlbertsStephanie Shanel Prazak, MSN, NP-C [facility] Pager: [phone] Office: [phone]  02/23/19  2:55 PM

## 2019-02-22 LAB — T-HELPER CELL (CD4) - (RCID CLINIC ONLY)
CD4 % Helper T Cell: 36 % (ref 33–65)
CD4 T Cell Abs: 1013 /uL (ref 400–1790)

## 2019-02-23 ENCOUNTER — Encounter: Payer: Self-pay | Admitting: Infectious Diseases

## 2019-02-23 NOTE — Assessment & Plan Note (Signed)
First Hep B today; second dose in 8 weeks. Will assess antibody with next lab draw.  Flu shot in the fall before November.

## 2019-02-23 NOTE — Assessment & Plan Note (Signed)
Darin [name] continues to do very well with his HIV treatment. Will update his HIV lab work today and continue his [place] and have him return in 6 months.  Vaccines updated.  Condoms/STI screen declined.   Return in about 6 months (around 08/24/2019).

## 2019-02-24 LAB — COMPLETE METABOLIC PANEL WITH GFR
AG Ratio: 1.5 (calc) (ref 1.0–2.5)
ALT: 42 U/L (ref 9–46)
AST: 23 U/L (ref 10–40)
Albumin: 4.4 g/dL (ref 3.6–5.1)
Alkaline phosphatase (APISO): 50 U/L (ref 36–130)
BUN: 11 mg/dL (ref 7–25)
CO2: 23 mmol/L (ref 20–32)
Calcium: 9.4 mg/dL (ref 8.6–10.3)
Chloride: 106 mmol/L (ref 98–110)
Creat: 1.07 mg/dL (ref 0.60–1.35)
GFR, Est African American: 110 mL/min/{1.73_m2} (ref >=60)
GFR, Est Non African American: 95 mL/min/{1.73_m2} (ref >=60)
Globulin: 2.9 g/dL (calc) (ref 1.9–3.7)
Glucose, Bld: 88 mg/dL (ref 65–99)
Potassium: 4.5 mmol/L (ref 3.5–5.3)
Sodium: 138 mmol/L (ref 135–146)
Total Bilirubin: 0.3 mg/dL (ref 0.2–1.2)
Total Protein: 7.3 g/dL (ref 6.1–8.1)

## 2019-02-24 LAB — CBC WITH DIFFERENTIAL/PLATELET
Absolute Monocytes: 661 cells/uL (ref 200–950)
Basophils Absolute: 30 cells/uL (ref 0–200)
Basophils Relative: 0.4 %
Eosinophils Absolute: 122 cells/uL (ref 15–500)
Eosinophils Relative: 1.6 %
HCT: 46.4 % (ref 38.5–50.0)
Hemoglobin: 15.5 g/dL (ref 13.2–17.1)
Lymphs Abs: 2949 cells/uL (ref 850–3900)
MCH: 28.4 pg (ref 27.0–33.0)
MCHC: 33.4 g/dL (ref 32.0–36.0)
MCV: 85.1 fL (ref 80.0–100.0)
MPV: 10.1 fL (ref 7.5–12.5)
Monocytes Relative: 8.7 %
Neutro Abs: 3838 cells/uL (ref 1500–7800)
Neutrophils Relative %: 50.5 %
Platelets: 345 10*3/uL (ref 140–400)
RBC: 5.45 10*6/uL (ref 4.20–5.80)
RDW: 13 % (ref 11.0–15.0)
Total Lymphocyte: 38.8 %
WBC: 7.6 10*3/uL (ref 3.8–10.8)

## 2019-02-24 LAB — HIV-1 RNA QUANT-NO REFLEX-BLD
HIV 1 RNA Quant: <20 copies/mL
HIV-1 RNA Quant, Log: <1.3 Log copies/mL

## 2019-02-27 MED FILL — BIKTARVY 50-200-25 MG TABS: 50-200-25 | 30 days supply | Qty: 30 | Fill #1

## 2019-04-03 MED FILL — BIKTARVY 50-200-25 MG TABS: 50-200-25 | 30 days supply | Qty: 30 | Fill #2

## 2019-04-24 ENCOUNTER — Ambulatory Visit (INDEPENDENT_AMBULATORY_CARE_PROVIDER_SITE_OTHER): Payer: BC Managed Care – PPO | Admitting: *Deleted

## 2019-04-24 ENCOUNTER — Other Ambulatory Visit: Payer: Self-pay

## 2019-04-24 DIAGNOSIS — Z23 Encounter for immunization: Secondary | ICD-10-CM | POA: Diagnosis not present

## 2019-05-04 MED FILL — BIKTARVY 50-200-25 MG TABS: 50-200-25 | 30 days supply | Qty: 30 | Fill #3

## 2019-05-30 MED FILL — BIKTARVY 50-200-25 MG TABS: 50-200-25 | 30 days supply | Qty: 30 | Fill #4

## 2019-06-08 DIAGNOSIS — Z20828 Contact with and (suspected) exposure to other viral communicable diseases: Secondary | ICD-10-CM | POA: Diagnosis not present

## 2019-06-28 MED FILL — BIKTARVY 50-200-25 MG TABS: 50-200-25 | 30 days supply | Qty: 30 | Fill #5

## 2019-07-03 DIAGNOSIS — Z20828 Contact with and (suspected) exposure to other viral communicable diseases: Secondary | ICD-10-CM | POA: Diagnosis not present

## 2019-07-25 ENCOUNTER — Other Ambulatory Visit: Payer: Self-pay | Admitting: Infectious Diseases

## 2019-07-25 DIAGNOSIS — B20 Human immunodeficiency virus [HIV] disease: Secondary | ICD-10-CM

## 2019-08-03 MED FILL — BIKTARVY 50-200-25 MG TABS: 50-200-25 | 30 days supply | Qty: 30 | Fill #0

## 2019-08-13 DIAGNOSIS — Z21 Asymptomatic human immunodeficiency virus [HIV] infection status: Secondary | ICD-10-CM | POA: Diagnosis not present

## 2019-08-13 DIAGNOSIS — R3129 Other microscopic hematuria: Secondary | ICD-10-CM | POA: Diagnosis not present

## 2019-08-13 DIAGNOSIS — N4889 Other specified disorders of penis: Secondary | ICD-10-CM | POA: Diagnosis not present

## 2019-08-24 ENCOUNTER — Other Ambulatory Visit: Payer: BC Managed Care – PPO

## 2019-09-04 ENCOUNTER — Telehealth: Payer: Self-pay

## 2019-09-04 ENCOUNTER — Other Ambulatory Visit: Payer: Self-pay

## 2019-09-04 DIAGNOSIS — Z79899 Other long term (current) drug therapy: Secondary | ICD-10-CM

## 2019-09-04 DIAGNOSIS — Z113 Encounter for screening for infections with a predominantly sexual mode of transmission: Secondary | ICD-10-CM

## 2019-09-04 DIAGNOSIS — B20 Human immunodeficiency virus [HIV] disease: Secondary | ICD-10-CM

## 2019-09-04 NOTE — Telephone Encounter (Signed)
COVID-19 Pre-Screening Questions:09/04/19   Do you currently have a fever (>100 F), chills or unexplained body aches? NO   Are you currently experiencing new cough, shortness of breath, sore throat, runny nose?NO  .  Have you recently travelled outside the state of [place] in the last 14 days? NO  .  Have you been in contact with someone that is currently pending confirmation of Covid19 testing or has been confirmed to have the Covid19 virus?  NO  **If the patient answers NO to ALL questions -  advise the patient to please call the clinic before coming to the office should any symptoms develop.     

## 2019-09-05 ENCOUNTER — Other Ambulatory Visit: Payer: Self-pay

## 2019-09-05 ENCOUNTER — Other Ambulatory Visit: Payer: BC Managed Care – PPO

## 2019-09-05 DIAGNOSIS — Z113 Encounter for screening for infections with a predominantly sexual mode of transmission: Secondary | ICD-10-CM | POA: Diagnosis not present

## 2019-09-05 DIAGNOSIS — B20 Human immunodeficiency virus [HIV] disease: Secondary | ICD-10-CM | POA: Diagnosis not present

## 2019-09-05 DIAGNOSIS — Z79899 Other long term (current) drug therapy: Secondary | ICD-10-CM

## 2019-09-06 LAB — T-HELPER CELL (CD4) - (RCID CLINIC ONLY)
CD4 % Helper T Cell: 38 % (ref 33–65)
CD4 T Cell Abs: 869 /uL (ref 400–1790)

## 2019-09-08 LAB — COMPLETE METABOLIC PANEL WITH GFR
AG Ratio: 1.5 (calc) (ref 1.0–2.5)
ALT: 57 U/L — ABNORMAL HIGH (ref 9–46)
AST: 38 U/L (ref 10–40)
Albumin: 4.3 g/dL (ref 3.6–5.1)
Alkaline phosphatase (APISO): 50 U/L (ref 36–130)
BUN: 15 mg/dL (ref 7–25)
CO2: 26 mmol/L (ref 20–32)
Calcium: 9.2 mg/dL (ref 8.6–10.3)
Chloride: 102 mmol/L (ref 98–110)
Creat: 1.02 mg/dL (ref 0.60–1.35)
GFR, Est African American: 115 mL/min/{1.73_m2} (ref >=60)
GFR, Est Non African American: 100 mL/min/{1.73_m2} (ref >=60)
Globulin: 2.8 g/dL (calc) (ref 1.9–3.7)
Glucose, Bld: 104 mg/dL — ABNORMAL HIGH (ref 65–99)
Potassium: 4.4 mmol/L (ref 3.5–5.3)
Sodium: 137 mmol/L (ref 135–146)
Total Bilirubin: 0.5 mg/dL (ref 0.2–1.2)
Total Protein: 7.1 g/dL (ref 6.1–8.1)

## 2019-09-08 LAB — CBC WITH DIFFERENTIAL/PLATELET
Absolute Monocytes: 529 cells/uL (ref 200–950)
Basophils Absolute: 34 cells/uL (ref 0–200)
Basophils Relative: 0.4 %
Eosinophils Absolute: 101 cells/uL (ref 15–500)
Eosinophils Relative: 1.2 %
HCT: 44.6 % (ref 38.5–50.0)
Hemoglobin: 14.7 g/dL (ref 13.2–17.1)
Lymphs Abs: 2369 cells/uL (ref 850–3900)
MCH: 28.7 pg (ref 27.0–33.0)
MCHC: 33 g/dL (ref 32.0–36.0)
MCV: 87.1 fL (ref 80.0–100.0)
MPV: 10.5 fL (ref 7.5–12.5)
Monocytes Relative: 6.3 %
Neutro Abs: 5368 cells/uL (ref 1500–7800)
Neutrophils Relative %: 63.9 %
Platelets: 287 10*3/uL (ref 140–400)
RBC: 5.12 10*6/uL (ref 4.20–5.80)
RDW: 12.8 % (ref 11.0–15.0)
Total Lymphocyte: 28.2 %
WBC: 8.4 10*3/uL (ref 3.8–10.8)

## 2019-09-08 LAB — FLUORESCENT TREPONEMAL AB(FTA)-IGG-BLD: Fluorescent Treponemal ABS: REACTIVE — AB

## 2019-09-08 LAB — LIPID PANEL
Cholesterol: 171 mg/dL (ref <200)
HDL: 50 mg/dL (ref >=40)
LDL Cholesterol (Calc): 103 mg/dL (calc) — ABNORMAL HIGH
Non-HDL Cholesterol (Calc): 121 mg/dL (calc) (ref <130)
Total CHOL/HDL Ratio: 3.4 (calc) (ref <5.0)
Triglycerides: 87 mg/dL (ref <150)

## 2019-09-08 LAB — HIV-1 RNA QUANT-NO REFLEX-BLD
HIV 1 RNA Quant: <20 copies/mL — AB
HIV-1 RNA Quant, Log: <1.3 Log copies/mL — AB

## 2019-09-08 LAB — RPR: RPR Ser Ql: REACTIVE — AB

## 2019-09-08 LAB — RPR TITER: RPR Titer: 1:2 {titer} — ABNORMAL HIGH

## 2019-09-12 ENCOUNTER — Telehealth: Payer: Self-pay

## 2019-09-12 NOTE — Telephone Encounter (Signed)
Brooks with [facility] is calling regarding patient's RPR titer of 1:2.  She is asking if patient has been treated.   He has not been treated or informed of lab results.  He is scheduled for office visit on 09-20-2019.  His initial visit was scheduled for 09-14-2019 which he cancelled.   Please advise.   Laurell Josephs, RN

## 2019-09-12 NOTE — Telephone Encounter (Signed)
Will need to inquire with Darin [name] regarding history of syphilis treatment and any active symptoms (ulcers to penis/anus/scrotum or mouth, rash to chest or hands/feet). If no symptoms and no history of treatment would like to get him started with 3 weekly injections of 2.4 million units bicillin please.   Can start this week if he is able - he needs to abstain from sex until he is fully treated. Looks like he sent a MYChart asking about these results recently but I have not received the forwarded message yet.   Thank you for forwarding, Darin [name].

## 2019-09-13 ENCOUNTER — Other Ambulatory Visit: Payer: Self-pay

## 2019-09-13 DIAGNOSIS — A539 Syphilis, unspecified: Secondary | ICD-10-CM

## 2019-09-13 MED ORDER — DOXYCYCLINE HYCLATE 100 MG PO TABS: 100.0000 mg | ORAL_TABLET | Freq: Two times a day (BID) | ORAL | 0 refills | Status: AC

## 2019-09-13 NOTE — Telephone Encounter (Signed)
Spoke with patient. He states he has never been diagnosed or treated for syphilis before. He was last sexually active in January 2021. He states he has not noticed any rashes or symptoms since then. RN shared Stephanie's advise to abstain until fully treated.   Please advise treatment due to allergy. He has a penicillin allergy, states he had hives all over his body as a child.  Has not taken it since then.  Andree Coss, RN

## 2019-09-13 NOTE — Telephone Encounter (Signed)
Sent prescription to patient's preferred pharmacy for Doxy 100mg  for 28 days; instructed the patient to take it 2 times a day with food + water and to be cautious in sunlight to avoid burning. Patient verbalized understanding and will follow up with , NP next week during follow up appointment 3/17.   Nancy Manuele 4/17, RN

## 2019-09-13 NOTE — Telephone Encounter (Signed)
Likely his penicillin allergy is not a true allergy - I will discuss further with him at upcoming visit.   For his syphilis would treat with 28 days Doxycycline 100 mg BID PO. Take with meals to avoid nausea, full glass of water. Caution in the sun as it can make people sensitive to sunburn.   Thank you!

## 2019-09-14 ENCOUNTER — Encounter: Payer: BC Managed Care – PPO | Admitting: Infectious Diseases

## 2019-09-14 MED FILL — BIKTARVY 50-200-25 MG TABS: 50-200-25 | 30 days supply | Qty: 30 | Fill #1

## 2019-09-20 ENCOUNTER — Ambulatory Visit (INDEPENDENT_AMBULATORY_CARE_PROVIDER_SITE_OTHER): Payer: BC Managed Care – PPO | Admitting: Infectious Diseases

## 2019-09-20 ENCOUNTER — Encounter: Payer: Self-pay | Admitting: Infectious Diseases

## 2019-09-20 ENCOUNTER — Other Ambulatory Visit: Payer: Self-pay

## 2019-09-20 VITALS — BP 128/78 | HR 79 | Temp 98.0°F | Ht 72.0 in | Wt 245.0 lb

## 2019-09-20 DIAGNOSIS — Z880 Allergy status to penicillin: Secondary | ICD-10-CM

## 2019-09-20 DIAGNOSIS — B20 Human immunodeficiency virus [HIV] disease: Secondary | ICD-10-CM

## 2019-09-20 DIAGNOSIS — Z21 Asymptomatic human immunodeficiency virus [HIV] infection status: Secondary | ICD-10-CM

## 2019-09-20 NOTE — Progress Notes (Signed)
Name: Darin [name]  DOB: 08/11/1990 MRN: [id number] PCP: Benito Mccreedy, MD    Patient Active Problem List   Diagnosis Date Noted  . Other allergic rhinitis 11/24/2018  . Penicillin allergy 11/24/2018  . Adverse food reaction 11/24/2018  . Allergic reaction 11/10/2018  . Healthcare maintenance 05/23/2018  . HIV (human immunodeficiency virus infection) ([place]) 05/22/2018     Brief Narrative:  Darin [name] is a 29 y.o. male with HIV, dx 04/2018.  History of OIs: none.  HIV Risk: MSM  Previous Regimens: . Biktarvy >> VL <20 copies   Genotypes: . 05-2018: no mutations detected   Subjective:  CC: HIV follow up care. No concerns or complaints.    HPI:  Darin [name] is doing very well. He continues to take his Biktarvy every day without any missed doses. He has no side effects or concerns with access to his medications. He is working full time presently. No new sexual partners or encounters since our last office visit together. He denies any concern for STI exposure/symptoms. He reports no concerns over depression or anxiety. He is sleeping and eating well.   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   No changes to medical history, no significant illnesses or hospitalization.   Review of Systems  Constitutional: Negative for chills, fever, malaise/fatigue and weight loss.  HENT: Negative for sore throat.        No dental problems  Respiratory: Negative for cough and sputum production.   Cardiovascular: Negative for chest pain and leg swelling.  Gastrointestinal: Negative for abdominal pain, diarrhea and vomiting.  Genitourinary: Negative for dysuria and flank pain.  Musculoskeletal: Negative for joint pain, myalgias and neck pain.  Skin: Negative for rash.  Neurological: Negative for dizziness, tingling and headaches.  Psychiatric/Behavioral: Negative for depression and substance abuse. The patient is not  nervous/anxious and does not have insomnia.     Past Medical History:  Diagnosis Date  . HIV infection ([place])   . Migraines     Outpatient Medications Prior to Visit  Medication Sig Dispense Refill  . aspirin-acetaminophen-caffeine (EXCEDRIN MIGRAINE) 250-250-65 MG tablet Take 2 tablets by mouth every 6 (six) hours as needed for headache.    Marland Kitchen BIKTARVY 50-200-25 MG TABS tablet TAKE 1 TABLET BY MOUTH DAILY. 30 tablet 5  . doxycycline (VIBRA-TABS) 100 MG tablet Take 1 tablet (100 mg total) by mouth 2 (two) times daily for 28 days. 56 tablet 0   No facility-administered medications prior to visit.     Allergies  Allergen Reactions  . Penicillins Hives    As a kid  Has patient had a PCN reaction causing immediate rash, facial/tongue/throat swelling, SOB or lightheadedness with hypotension: Yes Has patient had a PCN reaction causing severe rash involving mucus membranes or skin necrosis: No Has patient had a PCN reaction that required hospitalization: No Has patient had a PCN reaction occurring within the last 10 years: No If all of the above answers are "NO", then may proceed with Cephalosporin use.     Social History   Tobacco Use  . Smoking status: Passive Smoke Exposure - Never Smoker  . Smokeless tobacco: Never Used  . Tobacco comment: sister smokes inside the house  Substance Use Topics  . Alcohol use: Yes    Comment: occ  . Drug use: No    Social History   Substance and Sexual Activity  Sexual Activity Yes  . Partners: Male  .  Birth control/protection: Condom     Objective:   Vitals:   09/20/19 1625  BP: 128/78  Pulse: 79  Temp: 98 F (36.7 C)  SpO2: 98%  Weight: 245 lb (111.1 kg)  Height: 6' (1.829 m)   Body mass index is 33.23 kg/m.  Physical Exam Constitutional:      Appearance: He is well-developed.     Comments: Seated comfortably in chair during visit.   HENT:     Mouth/Throat:     Dentition: Normal dentition. No dental abscesses.    Cardiovascular:     Rate and Rhythm: Normal rate and regular rhythm.     Heart sounds: Normal heart sounds.  Pulmonary:     Effort: Pulmonary effort is normal.     Breath sounds: Normal breath sounds.  Abdominal:     General: There is no distension.     Palpations: Abdomen is soft.     Tenderness: There is no abdominal tenderness.  Lymphadenopathy:     Cervical: No cervical adenopathy.  Skin:    General: Skin is warm and dry.     Findings: No rash.  Neurological:     Mental Status: He is alert and oriented to person, place, and time.  Psychiatric:        Judgment: Judgment normal.     Comments: In good spirits today and engaged in care discussion.      Lab Results Lab Results  Component Value Date   WBC 8.4 09/05/2019   HGB 14.7 09/05/2019   HCT 44.6 09/05/2019   MCV 87.1 09/05/2019   PLT 287 09/05/2019    Lab Results  Component Value Date   CREATININE 1.02 09/05/2019   BUN 15 09/05/2019   NA 137 09/05/2019   K 4.4 09/05/2019   CL 102 09/05/2019   CO2 26 09/05/2019    Lab Results  Component Value Date   ALT 57 (H) 09/05/2019   AST 38 09/05/2019   ALKPHOS 60 12/08/2016   BILITOT 0.5 09/05/2019    Lab Results  Component Value Date   CHOL 171 09/05/2019   HDL 50 09/05/2019   LDLCALC 103 (H) 09/05/2019   TRIG 87 09/05/2019   CHOLHDL 3.4 09/05/2019   HIV 1 RNA Quant (copies/mL)  Date Value  09/05/2019 <20 DETECTED (A)  02/21/2019 <20 NOT DETECTED  08/11/2018 <20 DETECTED (A)   CD4 T Cell Abs (/uL)  Date Value  09/05/2019 869  02/21/2019 1,013  05/04/2018 770     Assessment & Plan:   Problem List Items Addressed This Visit      Unprioritized   Penicillin allergy - Primary    Will place referral to Allergy for consideration of penicillin skin test vs oral challenge in the office. I suspect he had viral rash based on his recollection. Would like to debunk this for him, however given that beta lactams including penicillins and derivatives are  preferred, cheaper and less side effects. He is agreeable.       Relevant Orders   Ambulatory referral to Allergy   HIV (human immunodeficiency virus infection) (HCC) (Chronic)    Doing well on Biktarvy with perfect suppression and full reconstitution of CD4 to 869 cells. Vaccines up to date. Discussed scheduling COVID vaccine as well   RTC in 6 months with labs prior to visit if he prefers virtual.        Other Visit Diagnoses    Human immunodeficiency virus (HIV) disease (HCC)  Rexene Alberts, MSN, NP-C [facility] Infectious Disease [facility] Pager: [phone] Office: [phone]  09/21/19  11:12 AM

## 2019-09-20 NOTE — Patient Instructions (Addendum)
Nice to see you today!  Please return in 3 months to repeat your blood work - can come for labs prior to your visit if you are able.   For your COVID Vaccine -  There are 3 options:   1. Pfizer - 2 doses 3 weeks apart, 95% protective  2. Moderna - 2 doses 4 weeks apart, 95% protective  3. Johnsen & Johnsen - 1 dose, 70% protective   To Schedule at [facility]:  To book an appointment, go to [email or web address]   OR  For the [employer] [facility] at [facility]:  Call [phone] to schedule your appointment    We will refer you to allergy team to debunk your penicillin allergy - I have a feeling you are safe to take this.

## 2019-09-21 NOTE — Assessment & Plan Note (Signed)
Will place referral to Allergy for consideration of penicillin skin test vs oral challenge in the office. I suspect he had viral rash based on his recollection. Would like to debunk this for him, however given that beta lactams including penicillins and derivatives are preferred, cheaper and less side effects. He is agreeable.

## 2019-09-21 NOTE — Assessment & Plan Note (Signed)
Doing well on Biktarvy with perfect suppression and full reconstitution of CD4 to 869 cells. Vaccines up to date. Discussed scheduling COVID vaccine as well   RTC in 6 months with labs prior to visit if he prefers virtual.

## 2019-10-11 MED FILL — BIKTARVY 50-200-25 MG TABS: 50-200-25 | 30 days supply | Qty: 30 | Fill #2

## 2019-11-06 MED FILL — BIKTARVY 50-200-25 MG TABS: 50-200-25 | 30 days supply | Qty: 30 | Fill #3

## 2019-12-05 MED FILL — BIKTARVY 50-200-25 MG TABS: 50-200-25 | 30 days supply | Qty: 30 | Fill #4

## 2019-12-07 ENCOUNTER — Other Ambulatory Visit (HOSPITAL_COMMUNITY)
Admission: RE | Admit: 2019-12-07 | Discharge: 2019-12-07 | Disposition: A | Discharge status: Home / Self Care | Payer: BC Managed Care – PPO | Source: Ambulatory Visit | Attending: Infectious Diseases | Admitting: Infectious Diseases

## 2019-12-07 ENCOUNTER — Other Ambulatory Visit: Payer: BC Managed Care – PPO

## 2019-12-07 ENCOUNTER — Other Ambulatory Visit: Payer: Self-pay

## 2019-12-07 ENCOUNTER — Other Ambulatory Visit: Payer: Self-pay | Admitting: *Deleted

## 2019-12-07 DIAGNOSIS — B20 Human immunodeficiency virus [HIV] disease: Secondary | ICD-10-CM

## 2019-12-07 DIAGNOSIS — A539 Syphilis, unspecified: Secondary | ICD-10-CM

## 2019-12-07 DIAGNOSIS — Z113 Encounter for screening for infections with a predominantly sexual mode of transmission: Secondary | ICD-10-CM | POA: Insufficient documentation

## 2019-12-07 DIAGNOSIS — Z21 Asymptomatic human immunodeficiency virus [HIV] infection status: Secondary | ICD-10-CM

## 2019-12-08 LAB — T-HELPER CELL (CD4) - (RCID CLINIC ONLY)
CD4 % Helper T Cell: 33 % (ref 33–65)
CD4 T Cell Abs: 1018 /uL (ref 400–1790)

## 2019-12-09 LAB — CBC WITH DIFFERENTIAL/PLATELET
Absolute Monocytes: 653 cells/uL (ref 200–950)
Basophils Absolute: 23 cells/uL (ref 0–200)
Basophils Relative: 0.3 %
Eosinophils Absolute: 120 cells/uL (ref 15–500)
Eosinophils Relative: 1.6 %
HCT: 46.1 % (ref 38.5–50.0)
Hemoglobin: 15.3 g/dL (ref 13.2–17.1)
Lymphs Abs: 2925 cells/uL (ref 850–3900)
MCH: 28.7 pg (ref 27.0–33.0)
MCHC: 33.2 g/dL (ref 32.0–36.0)
MCV: 86.5 fL (ref 80.0–100.0)
MPV: 10.3 fL (ref 7.5–12.5)
Monocytes Relative: 8.7 %
Neutro Abs: 3780 cells/uL (ref 1500–7800)
Neutrophils Relative %: 50.4 %
Platelets: 357 10*3/uL (ref 140–400)
RBC: 5.33 10*6/uL (ref 4.20–5.80)
RDW: 13.3 % (ref 11.0–15.0)
Total Lymphocyte: 39 %
WBC: 7.5 10*3/uL (ref 3.8–10.8)

## 2019-12-09 LAB — COMPLETE METABOLIC PANEL WITH GFR
AG Ratio: 1.6 (calc) (ref 1.0–2.5)
ALT: 44 U/L (ref 9–46)
AST: 31 U/L (ref 10–40)
Albumin: 4.4 g/dL (ref 3.6–5.1)
Alkaline phosphatase (APISO): 58 U/L (ref 36–130)
BUN: 11 mg/dL (ref 7–25)
CO2: 27 mmol/L (ref 20–32)
Calcium: 9.6 mg/dL (ref 8.6–10.3)
Chloride: 103 mmol/L (ref 98–110)
Creat: 1.11 mg/dL (ref 0.60–1.35)
GFR, Est African American: 104 mL/min/{1.73_m2} (ref >=60)
GFR, Est Non African American: 90 mL/min/{1.73_m2} (ref >=60)
Globulin: 2.8 g/dL (calc) (ref 1.9–3.7)
Glucose, Bld: 100 mg/dL — ABNORMAL HIGH (ref 65–99)
Potassium: 4.5 mmol/L (ref 3.5–5.3)
Sodium: 138 mmol/L (ref 135–146)
Total Bilirubin: 0.5 mg/dL (ref 0.2–1.2)
Total Protein: 7.2 g/dL (ref 6.1–8.1)

## 2019-12-09 LAB — HIV-1 RNA QUANT-NO REFLEX-BLD
HIV 1 RNA Quant: <20 copies/mL
HIV-1 RNA Quant, Log: <1.3 Log copies/mL

## 2019-12-09 LAB — RPR: RPR Ser Ql: NONREACTIVE

## 2019-12-11 LAB — URINE CYTOLOGY ANCILLARY ONLY
Chlamydia: NEGATIVE
Comment: NEGATIVE
Comment: NORMAL
Neisseria Gonorrhea: NEGATIVE

## 2020-01-01 ENCOUNTER — Other Ambulatory Visit: Payer: Self-pay | Admitting: Infectious Diseases

## 2020-01-01 ENCOUNTER — Ambulatory Visit (INDEPENDENT_AMBULATORY_CARE_PROVIDER_SITE_OTHER): Payer: BC Managed Care – PPO | Admitting: Infectious Diseases

## 2020-01-01 ENCOUNTER — Encounter: Payer: Self-pay | Admitting: Infectious Diseases

## 2020-01-01 ENCOUNTER — Other Ambulatory Visit: Payer: Self-pay

## 2020-01-01 VITALS — BP 114/76 | HR 61 | Wt 239.0 lb

## 2020-01-01 DIAGNOSIS — B20 Human immunodeficiency virus [HIV] disease: Secondary | ICD-10-CM

## 2020-01-01 DIAGNOSIS — Z21 Asymptomatic human immunodeficiency virus [HIV] infection status: Secondary | ICD-10-CM | POA: Diagnosis not present

## 2020-01-01 DIAGNOSIS — Z0000 Encounter for general adult medical examination without abnormal findings: Secondary | ICD-10-CM | POA: Diagnosis not present

## 2020-01-01 DIAGNOSIS — Z8619 Personal history of other infectious and parasitic diseases: Secondary | ICD-10-CM | POA: Insufficient documentation

## 2020-01-01 DIAGNOSIS — Z9229 Personal history of other drug therapy: Secondary | ICD-10-CM | POA: Diagnosis not present

## 2020-01-01 MED ORDER — BIKTARVY 50-200-25 MG PO TABS: 1.0000 | ORAL_TABLET | Freq: Every day | ORAL | 5 refills | Status: DC

## 2020-01-01 NOTE — Assessment & Plan Note (Signed)
Treated adequately with 30-d supply of doxycycline. RPR has sero reverted to negative. Discussed and explained he could become infected again in the future. Will screen Q62m.

## 2020-01-01 NOTE — Assessment & Plan Note (Signed)
Hep B surface antibody next lab draw to follow up immunity

## 2020-01-01 NOTE — Assessment & Plan Note (Signed)
Continues to do very well on Biktarvy - will have him continue this.  RTC in 6 months Condoms offered today Vaccines are all up to date

## 2020-01-01 NOTE — Patient Instructions (Signed)
Nice to see you today!  Please continue your Biktarvy once a day as you have been doing.   Will see you back in 6 months with labs prior to your visit if you can.

## 2020-01-01 NOTE — Progress Notes (Signed)
Name: Darin [name]  DOB: May 18, 1991 MRN: [id number] PCP: Benito Mccreedy, MD    Patient Active Problem List   Diagnosis Date Noted  . History of syphilis 01/01/2020  . Other allergic rhinitis 11/24/2018  . Penicillin allergy 11/24/2018  . Adverse food reaction 11/24/2018  . Allergic reaction 11/10/2018  . Healthcare maintenance 05/23/2018  . HIV (human immunodeficiency virus infection) ([place]) 05/22/2018     Brief Narrative:  Dahl is a 29 y.o. male with HIV, dx 04/2018.  History of OIs: none.  HIV Risk: MSM  Previous Regimens: . Biktarvy >> VL <20 copies   Genotypes: . 05-2018: no mutations detected   Subjective:  CC: HIV follow up care. No concerns or complaints.    HPI:  Darin [name] is doing very well. He continues to take his Biktarvy every day without any missed doses. He has no side effects or concerns with access to his medications. Continues to work from home at [employer] but will transition back to the office again soon in mid July he thinks. Received the COVID vaccine and completed Pfizer series in April.   Asking about syphilis test and if it is negative now.   No changes to medical history, no significant illnesses or hospitalization.   Review of Systems  Constitutional: Negative for chills, fever, malaise/fatigue and weight loss.  HENT: Negative for sore throat.        No dental problems  Respiratory: Negative for cough and sputum production.   Cardiovascular: Negative for chest pain and leg swelling.  Gastrointestinal: Negative for abdominal pain, diarrhea and vomiting.  Genitourinary: Negative for dysuria and flank pain.  Musculoskeletal: Negative for joint pain, myalgias and neck pain.  Skin: Negative for rash.  Neurological: Negative for dizziness, tingling and headaches.  Psychiatric/Behavioral: Negative for depression and substance abuse. The patient is not nervous/anxious and does not have insomnia.     Past Medical History:  Diagnosis Date  .  HIV infection ([place])   . Migraines     Outpatient Medications Prior to Visit  Medication Sig Dispense Refill  . aspirin-acetaminophen-caffeine (EXCEDRIN MIGRAINE) 250-250-65 MG tablet Take 2 tablets by mouth every 6 (six) hours as needed for headache.    Marland Kitchen BIKTARVY 50-200-25 MG TABS tablet TAKE 1 TABLET BY MOUTH DAILY. 30 tablet 5   No facility-administered medications prior to visit.     Allergies  Allergen Reactions  . Penicillins Hives    As a kid  Has patient had a PCN reaction causing immediate rash, facial/tongue/throat swelling, SOB or lightheadedness with hypotension: Yes Has patient had a PCN reaction causing severe rash involving mucus membranes or skin necrosis: No Has patient had a PCN reaction that required hospitalization: No Has patient had a PCN reaction occurring within the last 10 years: No If all of the above answers are "NO", then may proceed with Cephalosporin use.     Social History   Tobacco Use  . Smoking status: Passive Smoke Exposure - Never Smoker  . Smokeless tobacco: Never Used  . Tobacco comment: sister smokes inside the house  Substance Use Topics  . Alcohol use: Yes    Alcohol/week: 1.0 - 2.0 standard drink    Types: 1 - 2 Standard drinks or equivalent per week    Comment: Socially  . Drug use: No    Social History   Substance and Sexual Activity  Sexual Activity Yes  . Partners: Male  . Birth control/protection: Condom     Objective:   Vitals:   01/01/20  1606  BP: 114/76  Pulse: 61  SpO2: 98%  Weight: 239 lb (108.4 kg)   Body mass index is 32.41 kg/m.  Physical Exam Constitutional:      Appearance: He is well-developed.     Comments: Seated comfortably in chair during visit.   HENT:     Mouth/Throat:     Dentition: Normal dentition. No dental abscesses.  Cardiovascular:     Rate and Rhythm: Normal rate and regular rhythm.     Heart sounds: Normal heart sounds.  Pulmonary:     Effort: Pulmonary effort is normal.      Breath sounds: Normal breath sounds.  Abdominal:     General: There is no distension.     Palpations: Abdomen is soft.     Tenderness: There is no abdominal tenderness.  Lymphadenopathy:     Cervical: No cervical adenopathy.  Skin:    General: Skin is warm and dry.     Findings: No rash.  Neurological:     Mental Status: He is alert and oriented to person, place, and time.  Psychiatric:        Judgment: Judgment normal.     Comments: In good spirits today and engaged in care discussion.      Lab Results Lab Results  Component Value Date   WBC 7.5 12/07/2019   HGB 15.3 12/07/2019   HCT 46.1 12/07/2019   MCV 86.5 12/07/2019   PLT 357 12/07/2019    Lab Results  Component Value Date   CREATININE 1.11 12/07/2019   BUN 11 12/07/2019   NA 138 12/07/2019   K 4.5 12/07/2019   CL 103 12/07/2019   CO2 27 12/07/2019    Lab Results  Component Value Date   ALT 44 12/07/2019   AST 31 12/07/2019   ALKPHOS 60 12/08/2016   BILITOT 0.5 12/07/2019    Lab Results  Component Value Date   CHOL 171 09/05/2019   HDL 50 09/05/2019   LDLCALC 103 (H) 09/05/2019   TRIG 87 09/05/2019   CHOLHDL 3.4 09/05/2019   HIV 1 RNA Quant (copies/mL)  Date Value  12/07/2019 <20 NOT DETECTED  09/05/2019 <20 DETECTED (A)  02/21/2019 <20 NOT DETECTED   CD4 T Cell Abs (/uL)  Date Value  12/07/2019 1,018  09/05/2019 869  02/21/2019 1,013     Assessment & Plan:   Problem List Items Addressed This Visit      Unprioritized   HIV (human immunodeficiency virus infection) (HCC) - Primary (Chronic)    Continues to do very well on Biktarvy - will have him continue this.  RTC in 6 months Condoms offered today Vaccines are all up to date       Relevant Medications   bictegravir-emtricitabine-tenofovir AF (BIKTARVY) 50-200-25 MG TABS tablet   Other Relevant Orders   HIV-1 RNA quant-no reflex-bld   T-helper cell (CD4)- ([facility] clinic only)   RPR   Healthcare maintenance    Hep B surface  antibody next lab draw to follow up immunity      History of syphilis    Treated adequately with 30-d supply of doxycycline. RPR has sero reverted to negative. Discussed and explained he could become infected again in the future. Will screen Q75m.        Other Visit Diagnoses    Human immunodeficiency virus (HIV) disease (HCC)       Relevant Medications   bictegravir-emtricitabine-tenofovir AF (BIKTARVY) 50-200-25 MG TABS tablet   Has received second dose of hepatitis B vaccine  Relevant Orders   Hepatitis B surface antibody,qualitative     Rexene Alberts, MSN, NP-C [facility] for Infectious Disease [facility] Pager: [phone] Office: [phone]  01/01/20  4:36 PM

## 2020-01-04 MED FILL — BIKTARVY 50-200-25 MG TABS: 50-200-25 | 30 days supply | Qty: 30 | Fill #5

## 2020-02-05 MED FILL — BIKTARVY 50-200-25 MG TABS: 50-200-25 | 30 days supply | Qty: 30 | Fill #0

## 2020-02-09 DIAGNOSIS — Z131 Encounter for screening for diabetes mellitus: Secondary | ICD-10-CM | POA: Diagnosis not present

## 2020-02-09 DIAGNOSIS — Z0000 Encounter for general adult medical examination without abnormal findings: Secondary | ICD-10-CM | POA: Diagnosis not present

## 2020-02-09 DIAGNOSIS — Z1329 Encounter for screening for other suspected endocrine disorder: Secondary | ICD-10-CM | POA: Diagnosis not present

## 2020-02-09 DIAGNOSIS — Z1389 Encounter for screening for other disorder: Secondary | ICD-10-CM | POA: Diagnosis not present

## 2020-02-09 DIAGNOSIS — Z136 Encounter for screening for cardiovascular disorders: Secondary | ICD-10-CM | POA: Diagnosis not present

## 2020-03-04 DIAGNOSIS — Z20822 Contact with and (suspected) exposure to covid-19: Secondary | ICD-10-CM | POA: Diagnosis not present

## 2020-03-04 MED FILL — BIKTARVY 50-200-25 MG TABS: 50-200-25 | 30 days supply | Qty: 30 | Fill #1

## 2020-03-28 ENCOUNTER — Telehealth: Payer: Self-pay

## 2020-03-28 MED FILL — BIKTARVY 50-200-25 MG TABS: 50-200-25 | 30 days supply | Qty: 30 | Fill #2

## 2020-03-28 NOTE — Telephone Encounter (Signed)
[facility] Patient Advocate Encounter °  °Was successful in obtaining a [employer] copay card for Biktarvy.  This copay card will make the patients copay 0.00. ° °I have spoken with the patient.   ° °The billing information is as follows and has been shared with [facility]. ° ° ° ° ° ° ° °Conroy Goracke, CPhT °Specialty Pharmacy Patient Advocate °[facility] °Phone: [phone] °Fax:  [phone]  °

## 2020-05-03 MED FILL — BIKTARVY 50-200-25 MG TABS: 50-200-25 | 30 days supply | Qty: 30 | Fill #3

## 2020-06-04 ENCOUNTER — Telehealth: Payer: Self-pay

## 2020-06-04 NOTE — Telephone Encounter (Signed)
[facility] Patient Advocate Encounter  [facility] and I have been unsuccsessful in reaching patient to be able to refill medication.    We have tried multiple times without a response.  Desaree Downen, CPhT Specialty Pharmacy Patient Advocate [facility] Phone: [phone] Fax:  [phone]  

## 2020-07-01 ENCOUNTER — Other Ambulatory Visit: Payer: BC Managed Care – PPO

## 2020-07-01 ENCOUNTER — Other Ambulatory Visit: Payer: Self-pay

## 2020-07-01 DIAGNOSIS — Z9229 Personal history of other drug therapy: Secondary | ICD-10-CM

## 2020-07-01 DIAGNOSIS — Z21 Asymptomatic human immunodeficiency virus [HIV] infection status: Secondary | ICD-10-CM

## 2020-07-02 LAB — T-HELPER CELL (CD4) - (RCID CLINIC ONLY)
CD4 % Helper T Cell: 38 % (ref 33–65)
CD4 T Cell Abs: 1056 /uL (ref 400–1790)

## 2020-07-08 MED FILL — BIKTARVY 50-200-25 MG TABS: 50-200-25 | 30 days supply | Qty: 30 | Fill #4

## 2020-07-12 LAB — HIV-1 RNA QUANT-NO REFLEX-BLD
HIV 1 RNA Quant: <20 Copies/mL — ABNORMAL HIGH
HIV-1 RNA Quant, Log: <1.3 Log cps/mL — ABNORMAL HIGH

## 2020-07-12 LAB — RPR: RPR Ser Ql: NONREACTIVE

## 2020-07-12 LAB — HEPATITIS B SURFACE ANTIBODY,QUALITATIVE: Hep B S Ab: REACTIVE — AB

## 2020-07-15 ENCOUNTER — Encounter: Payer: BC Managed Care – PPO | Admitting: Infectious Diseases

## 2020-08-07 ENCOUNTER — Telehealth: Payer: Self-pay

## 2020-08-07 DIAGNOSIS — B20 Human immunodeficiency virus [HIV] disease: Secondary | ICD-10-CM

## 2020-08-07 MED ORDER — BIKTARVY 50-200-25 MG PO TABS: 1.0000 | ORAL_TABLET | Freq: Every day | ORAL | 0 refills | Status: DC

## 2020-08-07 NOTE — Telephone Encounter (Signed)
Per pharmacy staff, patient's Darin [name] will now need to be sent to [facility]. RN will transfer prescription there.   Sandie Ano, RN

## 2020-08-21 ENCOUNTER — Ambulatory Visit: Payer: BC Managed Care – PPO | Admitting: Infectious Diseases

## 2020-08-21 ENCOUNTER — Encounter: Payer: Self-pay | Admitting: Infectious Diseases

## 2020-08-21 ENCOUNTER — Other Ambulatory Visit: Payer: Self-pay

## 2020-08-21 DIAGNOSIS — B20 Human immunodeficiency virus [HIV] disease: Secondary | ICD-10-CM | POA: Diagnosis not present

## 2020-08-21 DIAGNOSIS — Z21 Asymptomatic human immunodeficiency virus [HIV] infection status: Secondary | ICD-10-CM | POA: Diagnosis not present

## 2020-08-21 MED ORDER — BIKTARVY 50-200-25 MG PO TABS: 1.0000 | ORAL_TABLET | Freq: Every day | ORAL | 5 refills | Status: DC

## 2020-08-21 NOTE — Patient Instructions (Signed)
Nice to see you!  Will send in refills for you.   We gave you your flu shot today.   Would like to see you back in 6 months. Can do labs at the visit or prior to - up to you.

## 2020-08-21 NOTE — Progress Notes (Signed)
Name: Darin [name]  DOB: August 03, 1990 MRN: [id number] PCP: Jackie Plum, MD      Brief Narrative:  Darin [name] is a 30 y.o. male with HIV, dx 04/2018.  History of OIs: none.  HIV Risk: MSM  Previous Regimens: . Biktarvy >> VL <20 copies   Genotypes: . 05-2018: no mutations detected   Subjective:  CC: HIV follow up care. Sleep is hit or miss.    HPI:  Been back to the gym now for a while - lifting and doing cardio. Pre-workout keeps him up at night sometimes but not to where it impacts the next day.  Asking about syphilis test and if it is negative now.  No changes to medical history, no significant illnesses or hospitalization.   Doing well on Biktarvy once a day. No problems. Not interested in injectable at this point. Happy with a one pill once a day. No new partners.    Review of Systems  Constitutional: Negative for chills, fever, malaise/fatigue and weight loss.  HENT: Negative for sore throat.        No dental problems  Respiratory: Negative for cough and sputum production.   Cardiovascular: Negative for chest pain and leg swelling.  Gastrointestinal: Negative for abdominal pain, diarrhea and vomiting.  Genitourinary: Negative for dysuria and flank pain.  Musculoskeletal: Negative for joint pain, myalgias and neck pain.  Skin: Negative for rash.  Neurological: Negative for dizziness, tingling and headaches.  Psychiatric/Behavioral: Negative for depression and substance abuse. The patient is not nervous/anxious and does not have insomnia.     Past Medical History:  Diagnosis Date  . HIV infection (HCC)   . Migraines     Outpatient Medications Prior to Visit  Medication Sig Dispense Refill  . aspirin-acetaminophen-caffeine (EXCEDRIN MIGRAINE) 250-250-65 MG tablet Take 2 tablets by mouth every 6 (six) hours as needed for headache.    . Multiple Vitamin (MULTIVITAMIN ADULT PO) Take by mouth.    . bictegravir-emtricitabine-tenofovir AF (BIKTARVY) 50-200-25 MG  TABS tablet Take 1 tablet by mouth daily. 30 tablet 0   No facility-administered medications prior to visit.     Allergies  Allergen Reactions  . Penicillins Hives    As a kid  Has patient had a PCN reaction causing immediate rash, facial/tongue/throat swelling, SOB or lightheadedness with hypotension: Yes Has patient had a PCN reaction causing severe rash involving mucus membranes or skin necrosis: No Has patient had a PCN reaction that required hospitalization: No Has patient had a PCN reaction occurring within the last 10 years: No If all of the above answers are "NO", then may proceed with Cephalosporin use.     Social History   Tobacco Use  . Smoking status: Passive Smoke Exposure - Never Smoker  . Smokeless tobacco: Never Used  . Tobacco comment: sister smokes inside the house  Substance Use Topics  . Alcohol use: Yes    Alcohol/week: 1.0 - 2.0 standard drink    Types: 1 - 2 Standard drinks or equivalent per week    Comment: Socially  . Drug use: No    Social History   Substance and Sexual Activity  Sexual Activity Yes  . Partners: Male  . Birth control/protection: Condom   Comment: accepted condoms     Objective:   Vitals:   08/21/20 1641  BP: 113/70  Pulse: 67  Temp: 98 F (36.7 C)  TempSrc: Oral  SpO2: 97%  Weight: 235 lb (106.6 kg)  Height: 6' (1.829 m)   Body mass index  is 31.87 kg/m.  Physical Exam Constitutional:      Appearance: He is well-developed.     Comments: Seated comfortably in chair during visit.   HENT:     Mouth/Throat:     Dentition: Normal dentition. No dental abscesses.  Cardiovascular:     Rate and Rhythm: Normal rate and regular rhythm.     Heart sounds: Normal heart sounds.  Pulmonary:     Effort: Pulmonary effort is normal.     Breath sounds: Normal breath sounds.  Abdominal:     General: There is no distension.     Palpations: Abdomen is soft.     Tenderness: There is no abdominal tenderness.  Lymphadenopathy:      Cervical: No cervical adenopathy.  Skin:    General: Skin is warm and dry.     Findings: No rash.  Neurological:     Mental Status: He is alert and oriented to person, place, and time.  Psychiatric:        Judgment: Judgment normal.     Comments: In good spirits today and engaged in care discussion.      Lab Results Lab Results  Component Value Date   WBC 7.5 12/07/2019   HGB 15.3 12/07/2019   HCT 46.1 12/07/2019   MCV 86.5 12/07/2019   PLT 357 12/07/2019    Lab Results  Component Value Date   CREATININE 1.11 12/07/2019   BUN 11 12/07/2019   NA 138 12/07/2019   K 4.5 12/07/2019   CL 103 12/07/2019   CO2 27 12/07/2019    Lab Results  Component Value Date   ALT 44 12/07/2019   AST 31 12/07/2019   ALKPHOS 60 12/08/2016   BILITOT 0.5 12/07/2019    Lab Results  Component Value Date   CHOL 171 09/05/2019   HDL 50 09/05/2019   LDLCALC 103 (H) 09/05/2019   TRIG 87 09/05/2019   CHOLHDL 3.4 09/05/2019   HIV 1 RNA Quant  Date Value  07/01/2020 <20 Copies/mL (H)  12/07/2019 <20 NOT DETECTED copies/mL  09/05/2019 <20 DETECTED copies/mL (A)   CD4 T Cell Abs (/uL)  Date Value  07/01/2020 1,056  12/07/2019 1,018  09/05/2019 869     Assessment & Plan:   Problem List Items Addressed This Visit      Unprioritized   HIV (human immunodeficiency virus infection) (HCC) (Chronic)    Montre is doing well on biktarvy once daily. He has had full reconstitution of immune system and durably suppressed viral loads. He is not interested in changing therapy and there is no indication to. Creatinine is stable. Will update pertinent labs today and see him back Q54m.  No STI screen needed today.  Flu shot today Has received COVID booster.    HIV 1 RNA Quant  Date Value  07/01/2020 <20 Copies/mL (H)  12/07/2019 <20 NOT DETECTED copies/mL  09/05/2019 <20 DETECTED copies/mL (A)   CD4 T Cell Abs (/uL)  Date Value  07/01/2020 1,056  12/07/2019 1,018  09/05/2019 869          Relevant Medications   bictegravir-emtricitabine-tenofovir AF (BIKTARVY) 50-200-25 MG TABS tablet    Other Visit Diagnoses    Human immunodeficiency virus (HIV) disease (HCC)       Relevant Medications   bictegravir-emtricitabine-tenofovir AF (BIKTARVY) 50-200-25 MG TABS tablet     Rexene Alberts, MSN, NP-C [facility]  [facility] Pager: [phone] Office: [phone]  08/28/20  11:22 AM

## 2020-08-28 NOTE — Assessment & Plan Note (Signed)
Darin [name] is doing well on biktarvy once daily. He has had full reconstitution of immune system and durably suppressed viral loads. He is not interested in changing therapy and there is no indication to. Creatinine is stable. Will update pertinent labs today and see him back Q87m.  No STI screen needed today.  Flu shot today Has received COVID booster.    HIV 1 RNA Quant  Date Value  07/01/2020 <20 Copies/mL (H)  12/07/2019 <20 NOT DETECTED copies/mL  09/05/2019 <20 DETECTED copies/mL (A)   CD4 T Cell Abs (/uL)  Date Value  07/01/2020 1,056  12/07/2019 1,018  09/05/2019 869

## 2020-09-27 ENCOUNTER — Other Ambulatory Visit (HOSPITAL_COMMUNITY): Payer: Self-pay

## 2021-02-24 ENCOUNTER — Other Ambulatory Visit: Payer: Self-pay

## 2021-02-24 ENCOUNTER — Encounter: Payer: Self-pay | Admitting: Infectious Diseases

## 2021-02-24 ENCOUNTER — Other Ambulatory Visit (HOSPITAL_COMMUNITY): Payer: Self-pay

## 2021-02-24 ENCOUNTER — Other Ambulatory Visit (HOSPITAL_COMMUNITY)
Admission: RE | Admit: 2021-02-24 | Discharge: 2021-02-24 | Disposition: A | Discharge status: Home / Self Care | Payer: BC Managed Care – PPO | Source: Ambulatory Visit | Attending: Infectious Diseases | Admitting: Infectious Diseases

## 2021-02-24 ENCOUNTER — Ambulatory Visit (INDEPENDENT_AMBULATORY_CARE_PROVIDER_SITE_OTHER): Payer: BC Managed Care – PPO | Admitting: Infectious Diseases

## 2021-02-24 VITALS — BP 133/75 | HR 84 | Temp 98.3°F | Wt 240.0 lb

## 2021-02-24 DIAGNOSIS — Z23 Encounter for immunization: Secondary | ICD-10-CM | POA: Diagnosis not present

## 2021-02-24 DIAGNOSIS — Z21 Asymptomatic human immunodeficiency virus [HIV] infection status: Secondary | ICD-10-CM | POA: Diagnosis not present

## 2021-02-24 DIAGNOSIS — R1909 Other intra-abdominal and pelvic swelling, mass and lump: Secondary | ICD-10-CM | POA: Insufficient documentation

## 2021-02-24 DIAGNOSIS — Z7289 Other problems related to lifestyle: Secondary | ICD-10-CM | POA: Diagnosis not present

## 2021-02-24 DIAGNOSIS — R102 Pelvic and perineal pain: Secondary | ICD-10-CM | POA: Insufficient documentation

## 2021-02-24 DIAGNOSIS — Z113 Encounter for screening for infections with a predominantly sexual mode of transmission: Secondary | ICD-10-CM | POA: Diagnosis not present

## 2021-02-24 DIAGNOSIS — Z79899 Other long term (current) drug therapy: Secondary | ICD-10-CM

## 2021-02-24 DIAGNOSIS — B20 Human immunodeficiency virus [HIV] disease: Secondary | ICD-10-CM

## 2021-02-24 MED ORDER — BIKTARVY 50-200-25 MG PO TABS
1.0000 | ORAL_TABLET | Freq: Every day | ORAL | 2 refills | Status: DC
  Filled 2021-02-24 – 2021-03-04 (×2): qty 30, 30d supply, fill #0
  Filled 2021-03-27 – 2021-04-01 (×2): qty 30, 30d supply, fill #1
  Filled 2021-04-24: qty 30, 30d supply, fill #2

## 2021-02-24 MED ORDER — DOXYCYCLINE HYCLATE 100 MG PO TABS
100.0000 mg | ORAL_TABLET | Freq: Two times a day (BID) | ORAL | 0 refills | Status: AC
Start: 2021-02-24 — End: 2021-03-17
  Filled 2021-02-24: qty 42, 21d supply, fill #0

## 2021-02-24 NOTE — Progress Notes (Signed)
Name: Darin [name]  DOB: 1990/09/09 MRN: [id number] PCP: Jackie Plum, MD    Brief Narrative:  Duane is a 30 y.o. male with HIV, dx 04/2018.  History of OIs: none.  HIV Risk: MSM  Previous Regimens: Biktarvy >> VL <20 copies   Genotypes: 05-2018: no mutations detected   Subjective:  CC: HIV follow up care.  Moving to [place] this weekend.     HPI:  LOV 08/21/2020 - doing well and planning on moving to [place] permanently. Has had 1 new partner over the last 2 weeks.  Has noticed some groin pain this past weekend, both sides but R>L and significantly more painful. There is no pain walking around or bearing down but when he touches the area it is painful. No urinary or rectal symptoms. No rashes or ulcers.   Needs some help with transferring care locally once he settles in [place], [place]. No missed doses of Biktarvy; no challenges with access and no side effects. No changes in his health aside from acute groin concerns as mentioned above.     Review of Systems  Constitutional:  Negative for chills, fever, malaise/fatigue and weight loss.  HENT:  Negative for sore throat.        No dental problems  Respiratory:  Negative for cough and sputum production.   Cardiovascular:  Negative for chest pain and leg swelling.  Gastrointestinal:  Negative for abdominal pain, diarrhea and vomiting.  Genitourinary:  Negative for dysuria and flank pain.       +Groin pain R>L  Musculoskeletal:  Negative for joint pain, myalgias and neck pain.  Skin:  Negative for rash.  Neurological:  Negative for dizziness, tingling and headaches.  Psychiatric/Behavioral:  Negative for depression and substance abuse. The patient is not nervous/anxious and does not have insomnia.     Past Medical History:  Diagnosis Date   HIV infection (HCC)    Migraines     Outpatient Medications Prior to Visit  Medication Sig Dispense Refill   aspirin-acetaminophen-caffeine (EXCEDRIN MIGRAINE)  250-250-65 MG tablet Take 2 tablets by mouth every 6 (six) hours as needed for headache.     Multiple Vitamin (MULTIVITAMIN ADULT PO) Take by mouth.     bictegravir-emtricitabine-tenofovir AF (BIKTARVY) 50-200-25 MG TABS tablet Take 1 tablet by mouth daily. 30 tablet 5   No facility-administered medications prior to visit.     Allergies  Allergen Reactions   Penicillins Hives    As a kid  Has patient had a PCN reaction causing immediate rash, facial/tongue/throat swelling, SOB or lightheadedness with hypotension: Yes Has patient had a PCN reaction causing severe rash involving mucus membranes or skin necrosis: No Has patient had a PCN reaction that required hospitalization: No Has patient had a PCN reaction occurring within the last 10 years: No If all of the above answers are "NO", then may proceed with Cephalosporin use.     Social History   Tobacco Use   Smoking status: Never    Passive exposure: Yes   Smokeless tobacco: Never   Tobacco comments:    sister smokes inside the house  Substance Use Topics   Alcohol use: Yes    Alcohol/week: 1.0 - 2.0 standard drink    Types: 1 - 2 Standard drinks or equivalent per week    Comment: Socially   Drug use: No    Social History   Substance and Sexual Activity  Sexual Activity Yes   Partners: Male   Birth control/protection: Condom   Comment: accepted  condoms     Objective:   Vitals:   02/24/21 1046  BP: 133/75  Pulse: 84  Temp: 98.3 F (36.8 C)  TempSrc: Oral  SpO2: 96%  Weight: 240 lb (108.9 kg)   Body mass index is 32.55 kg/m.  Physical Exam Constitutional:      Appearance: He is well-developed.     Comments: Seated comfortably in chair during visit.   HENT:     Mouth/Throat:     Dentition: Normal dentition. No dental abscesses.  Cardiovascular:     Rate and Rhythm: Normal rate and regular rhythm.     Heart sounds: Normal heart sounds.  Pulmonary:     Effort: Pulmonary effort is normal.     Breath  sounds: Normal breath sounds.  Abdominal:     General: There is no distension.     Palpations: Abdomen is soft.     Tenderness: There is no abdominal tenderness.  Lymphadenopathy:     Cervical: No cervical adenopathy.  Skin:    General: Skin is warm and dry.     Findings: No rash.          Comments: Rt groin swelling with palpable lymph node ~2 cm and tenderness. No erythema to skin. Lt groin general fullness without palpable lymphadenopathy.  Scrotum/penis normal.   Neurological:     Mental Status: He is alert and oriented to person, place, and time.  Psychiatric:        Judgment: Judgment normal.     Comments: In good spirits today and engaged in care discussion.     Lab Results Lab Results  Component Value Date   WBC 7.5 12/07/2019   HGB 15.3 12/07/2019   HCT 46.1 12/07/2019   MCV 86.5 12/07/2019   PLT 357 12/07/2019    Lab Results  Component Value Date   CREATININE 1.11 12/07/2019   BUN 11 12/07/2019   NA 138 12/07/2019   K 4.5 12/07/2019   CL 103 12/07/2019   CO2 27 12/07/2019    Lab Results  Component Value Date   ALT 44 12/07/2019   AST 31 12/07/2019   ALKPHOS 60 12/08/2016   BILITOT 0.5 12/07/2019    Lab Results  Component Value Date   CHOL 171 09/05/2019   HDL 50 09/05/2019   LDLCALC 103 (H) 09/05/2019   TRIG 87 09/05/2019   CHOLHDL 3.4 09/05/2019   HIV 1 RNA Quant  Date Value  07/01/2020 <20 Copies/mL (H)  12/07/2019 <20 NOT DETECTED copies/mL  09/05/2019 <20 DETECTED copies/mL (A)   CD4 T Cell Abs (/uL)  Date Value  07/01/2020 1,056  12/07/2019 1,018  09/05/2019 869     Assessment & Plan:   Problem List Items Addressed This Visit       Unprioritized   HIV (human immunodeficiency virus infection) (HCC) - Primary (Chronic)    Deago has done well on Biktarvy with long term undetectable viral loads and recovered CD4  Will update pertinent labs today including CMP, CBC, Lipids, RPR, STI screen, VL and CD4.  I asked him to please try  to find a new ID provider locally once he gets settled - health department may help with this referral as well, or we can when he has a permanent address in [place].      Relevant Medications   bictegravir-emtricitabine-tenofovir AF (BIKTARVY) 50-200-25 MG TABS tablet   Other Relevant Orders   HIV-1 RNA quant-no reflex-bld   T-helper cell (CD4)- ([facility] clinic only)   COMPLETE METABOLIC PANEL  WITH GFR   CBC with Differential/Platelet   Groin swelling    Lymphadenopathy that is tender overlying R groin with palpable lymph node. Left groin is tender but no significant lymphadenopathy appreciated. No rash on the skin noted. No rectal or urinary symptoms and no systemic symptoms present.  Will obtain urine sample and treat for presumptive LGV with doxycycline x 21d.  Discussed monkeypox vaccine today - with suspicion for STD will vaccinate with intradermal Jynneos today. Advised to find local health department after he moves or 2nd dose.       Relevant Medications   doxycycline (VIBRA-TABS) 100 MG tablet   Other Relevant Orders   Urine cytology ancillary only   RPR   Other Visit Diagnoses     High risk medication use       Relevant Orders   Lipid panel   Human immunodeficiency virus (HIV) disease (HCC)       Relevant Medications   bictegravir-emtricitabine-tenofovir AF (BIKTARVY) 50-200-25 MG TABS tablet      Rexene Alberts, MSN, NP-C [facility] Pager: [phone] Office: [phone]  02/24/21  11:27 AM

## 2021-02-24 NOTE — Patient Instructions (Addendum)
Please CONTINUE your BIKARVY once daily.   START DOXYCYCLINE one capsule twice daily with food for 3 weeks to treat the groin pain. I do have suspicion that this is due to a STD. Will check your urine today.   We gave you your first Monkeypox vaccine today - please get your second dose from local health department in 4 weeks (Around Sept 19th).

## 2021-02-24 NOTE — Progress Notes (Signed)
    [facility]  Name:  Darin [name]    MRN: [id number] DOB: December 19, 1990   02/24/2021  [name] was observed post JYNNEOS immunization for 15 minutes without incident. He was provided with Vaccine Information Sheet and instruction to access the V-Safe system.   [name] was instructed to call 911 with any severe reactions post vaccine: Difficulty breathing  Swelling of face and throat  A fast heartbeat  A bad rash all over body  Dizziness and weakness    Juanita Laster, RMA

## 2021-02-24 NOTE — Assessment & Plan Note (Signed)
Lymphadenopathy that is tender overlying R groin with palpable lymph node. Left groin is tender but no significant lymphadenopathy appreciated. No rash on the skin noted. No rectal or urinary symptoms and no systemic symptoms present.  Will obtain urine sample and treat for presumptive LGV with doxycycline x 21d.  Discussed monkeypox vaccine today - with suspicion for STD will vaccinate with intradermal Jynneos today. Advised to find local health department after he moves or 2nd dose.

## 2021-02-24 NOTE — Assessment & Plan Note (Signed)
[place] has done well on Biktarvy with long term undetectable viral loads and recovered CD4  Will update pertinent labs today including CMP, CBC, Lipids, RPR, STI screen, VL and CD4.  I asked him to please try to find a new ID provider locally once he gets settled - health department may help with this referral as well, or we can when he has a permanent address in [place].

## 2021-02-25 LAB — T-HELPER CELL (CD4) - (RCID CLINIC ONLY)
CD4 % Helper T Cell: 45 % (ref 33–65)
CD4 T Cell Abs: 789 /uL (ref 400–1790)

## 2021-02-25 LAB — URINE CYTOLOGY ANCILLARY ONLY
Chlamydia: NEGATIVE
Comment: NEGATIVE
Comment: NORMAL
Neisseria Gonorrhea: NEGATIVE

## 2021-02-26 LAB — COMPLETE METABOLIC PANEL WITH GFR
AG Ratio: 1.5 (calc) (ref 1.0–2.5)
ALT: 24 U/L (ref 9–46)
AST: 22 U/L (ref 10–40)
Albumin: 4.5 g/dL (ref 3.6–5.1)
Alkaline phosphatase (APISO): 68 U/L (ref 36–130)
BUN: 22 mg/dL (ref 7–25)
CO2: 26 mmol/L (ref 20–32)
Calcium: 9.2 mg/dL (ref 8.6–10.3)
Chloride: 104 mmol/L (ref 98–110)
Creat: 1.09 mg/dL (ref 0.60–1.24)
Globulin: 3 g/dL (calc) (ref 1.9–3.7)
Glucose, Bld: 100 mg/dL — ABNORMAL HIGH (ref 65–99)
Potassium: 4 mmol/L (ref 3.5–5.3)
Sodium: 139 mmol/L (ref 135–146)
Total Bilirubin: 0.3 mg/dL (ref 0.2–1.2)
Total Protein: 7.5 g/dL (ref 6.1–8.1)
eGFR: 94 mL/min/{1.73_m2} (ref >=60)

## 2021-02-26 LAB — CBC WITH DIFFERENTIAL/PLATELET
Absolute Monocytes: 946 cells/uL (ref 200–950)
Basophils Absolute: 46 cells/uL (ref 0–200)
Basophils Relative: 0.4 %
Eosinophils Absolute: 80 cells/uL (ref 15–500)
Eosinophils Relative: 0.7 %
HCT: 45.2 % (ref 38.5–50.0)
Hemoglobin: 14.9 g/dL (ref 13.2–17.1)
Lymphs Abs: 1847 cells/uL (ref 850–3900)
MCH: 28.3 pg (ref 27.0–33.0)
MCHC: 33 g/dL (ref 32.0–36.0)
MCV: 85.9 fL (ref 80.0–100.0)
MPV: 10.1 fL (ref 7.5–12.5)
Monocytes Relative: 8.3 %
Neutro Abs: 8482 cells/uL — ABNORMAL HIGH (ref 1500–7800)
Neutrophils Relative %: 74.4 %
Platelets: 335 10*3/uL (ref 140–400)
RBC: 5.26 10*6/uL (ref 4.20–5.80)
RDW: 12.7 % (ref 11.0–15.0)
Total Lymphocyte: 16.2 %
WBC: 11.4 10*3/uL — ABNORMAL HIGH (ref 3.8–10.8)

## 2021-02-26 LAB — LIPID PANEL
Cholesterol: 149 mg/dL (ref <200)
HDL: 44 mg/dL (ref >=40)
LDL Cholesterol (Calc): 83 mg/dL (calc)
Non-HDL Cholesterol (Calc): 105 mg/dL (calc) (ref <130)
Total CHOL/HDL Ratio: 3.4 (calc) (ref <5.0)
Triglycerides: 127 mg/dL (ref <150)

## 2021-02-26 LAB — HIV-1 RNA QUANT-NO REFLEX-BLD
HIV 1 RNA Quant: NOT DETECTED Copies/mL
HIV-1 RNA Quant, Log: NOT DETECTED Log cps/mL

## 2021-02-26 LAB — RPR: RPR Ser Ql: NONREACTIVE

## 2021-03-03 NOTE — Telephone Encounter (Signed)
Patient scheduled for 03/04/21 with Rexene Alberts, NP.   Sandie Ano, RN

## 2021-03-04 ENCOUNTER — Other Ambulatory Visit: Payer: Self-pay

## 2021-03-04 ENCOUNTER — Ambulatory Visit (INDEPENDENT_AMBULATORY_CARE_PROVIDER_SITE_OTHER): Payer: BC Managed Care – PPO | Admitting: Infectious Diseases

## 2021-03-04 ENCOUNTER — Other Ambulatory Visit (HOSPITAL_COMMUNITY): Payer: Self-pay

## 2021-03-04 ENCOUNTER — Encounter: Payer: Self-pay | Admitting: Infectious Diseases

## 2021-03-04 VITALS — BP 126/76 | HR 78 | Temp 98.2°F

## 2021-03-04 DIAGNOSIS — R1909 Other intra-abdominal and pelvic swelling, mass and lump: Secondary | ICD-10-CM

## 2021-03-04 DIAGNOSIS — R21 Rash and other nonspecific skin eruption: Secondary | ICD-10-CM | POA: Diagnosis not present

## 2021-03-04 NOTE — Assessment & Plan Note (Signed)
Mixed presentation with ulcerated lesions on base of penis and Rt inguinal fold and papular pustular rash along back, arms and eyebrow.  MPX testing today x 2 lesions  HSV testing for penile lesion today.  Isolation discussed - results over the next 3-5 days. Will call with results  No empiric treatment today.

## 2021-03-04 NOTE — Progress Notes (Signed)
Name: Darin [name]  DOB: [birth year] MRN: [id number] PCP: Jackie Plum, MD    Brief Narrative:  Jayr is a 30 y.o. male with HIV, dx 04/2018.  History of OIs: none.  HIV Risk: MSM  Previous Regimens: Biktarvy >> VL <20 copies   Genotypes: 05-2018: no mutations detected    Subjective:   Chief Complaint  Patient presents with   Follow-up    B20 - pt reports two lesions - one on penis and other in groin area.      HPI:  He has had an improvement and resolution of enlarged lymph nodes of the right groin since starting the doxycycline. He has over the last 24 hours noticed 2 blisters that have come up on the base of the penis and on the right inner thigh above the scrotum. He felt some pain in the shower prior to the actual rash coming out. These have since scabbed over and feel a little itchy. He has been using antibacterial soap and neosporin topically.   New onset lesions on the penis and back / left arm. These look like little white pimples. He feels that the ones on his back are bug bites. The one on the left arm is new and did not notice that until I pointed it out.  All of these have come up over the last 3 days.  He does report picking at several of the lesions and pulling out hairs from the hair bumps.    Review of Systems  Constitutional:  Negative for chills and fever.  HENT:  Negative for sore throat.   Eyes:  Negative for visual disturbance.  Gastrointestinal:  Negative for abdominal pain, anal bleeding and rectal pain.  Genitourinary:  Negative for dysuria, genital sores, penile discharge, penile pain, scrotal swelling and testicular pain.  Musculoskeletal:  Negative for arthralgias and joint swelling.  Skin:  Positive for rash.  Neurological:  Negative for headaches.  Hematological:  Negative for adenopathy.        Past Medical History:  Diagnosis Date   HIV infection (HCC)    Migraines     Outpatient Medications Prior to Visit  Medication  Sig Dispense Refill   aspirin-acetaminophen-caffeine (EXCEDRIN MIGRAINE) 250-250-65 MG tablet Take 2 tablets by mouth every 6 (six) hours as needed for headache.     bictegravir-emtricitabine-tenofovir AF (BIKTARVY) 50-200-25 MG TABS tablet Take 1 tablet by mouth daily. 30 tablet 2   doxycycline (VIBRA-TABS) 100 MG tablet Take 1 tablet (100 mg total) by mouth 2 (two) times daily for 21 days. 42 tablet 0   Multiple Vitamin (MULTIVITAMIN ADULT PO) Take by mouth.     No facility-administered medications prior to visit.     Allergies  Allergen Reactions   Penicillins Hives    As a kid  Has patient had a PCN reaction causing immediate rash, facial/tongue/throat swelling, SOB or lightheadedness with hypotension: Yes Has patient had a PCN reaction causing severe rash involving mucus membranes or skin necrosis: No Has patient had a PCN reaction that required hospitalization: No Has patient had a PCN reaction occurring within the last 10 years: No If all of the above answers are "NO", then may proceed with Cephalosporin use.     Social History   Tobacco Use   Smoking status: Never    Passive exposure: Yes   Smokeless tobacco: Never   Tobacco comments:    sister smokes inside the house  Substance Use Topics   Alcohol use: Yes    Alcohol/week:  1.0 - 2.0 standard drink    Types: 1 - 2 Standard drinks or equivalent per week    Comment: Socially   Drug use: No    Social History   Substance and Sexual Activity  Sexual Activity Yes   Partners: Male   Birth control/protection: Condom   Comment: accepted condoms     Objective:   Vitals:   03/04/21 1442  BP: 126/76  Pulse: 78  Temp: 98.2 F (36.8 C)  TempSrc: Oral  SpO2: 98%   There is no height or weight on file to calculate BMI.  Physical Exam Vitals reviewed. Exam conducted with a chaperone present.  Constitutional:      Appearance: He is not ill-appearing.  HENT:     Mouth/Throat:     Mouth: Mucous membranes are  moist.     Pharynx: Oropharynx is clear.  Genitourinary:      Comments: Lymph node swelling has resolved.  Small < 0.5 cm ulcer at the base of the penis, slightly indurated at base. Non tender Larger 0.5 cm ulcer to the patients right ingunal area. Indurated but not painful. No expressed drainage.  Skin:         Comments: White papules with slightly reddened base noted on arms.  More umbilicated appearing papules on back  Left eye brow with more scabbed/scratched papule that appears larger.   Neurological:     Mental Status: He is alert.    Lab Results Lab Results  Component Value Date   WBC 11.4 (H) 02/24/2021   HGB 14.9 02/24/2021   HCT 45.2 02/24/2021   MCV 85.9 02/24/2021   PLT 335 02/24/2021    Lab Results  Component Value Date   CREATININE 1.09 02/24/2021   BUN 22 02/24/2021   NA 139 02/24/2021   K 4.0 02/24/2021   CL 104 02/24/2021   CO2 26 02/24/2021    Lab Results  Component Value Date   ALT 24 02/24/2021   AST 22 02/24/2021   ALKPHOS 60 12/08/2016   BILITOT 0.3 02/24/2021    Lab Results  Component Value Date   CHOL 149 02/24/2021   HDL 44 02/24/2021   LDLCALC 83 02/24/2021   TRIG 127 02/24/2021   CHOLHDL 3.4 02/24/2021   HIV 1 RNA Quant  Date Value  02/24/2021 Not Detected Copies/mL  07/01/2020 <20 Copies/mL (H)  12/07/2019 <20 NOT DETECTED copies/mL   CD4 T Cell Abs (/uL)  Date Value  02/24/2021 789  07/01/2020 1,056  12/07/2019 1,018     Assessment & Plan:   Problem List Items Addressed This Visit       Unprioritized   Rash - Primary    Mixed presentation with ulcerated lesions on base of penis and Rt inguinal fold and papular pustular rash along back, arms and eyebrow.  MPX testing today x 2 lesions  HSV testing for penile lesion today.  Isolation discussed - results over the next 3-5 days. Will call with results  No empiric treatment today.       Relevant Orders   Monkeypox Virus DNA, Qualitative Real-Time PCR   Monkeypox  Virus DNA, Qualitative Real-Time PCR   Herpes Simplex Virus Culture w/ Rflx to Typing   Groin swelling    Resolved - continue doxy bid for presumed LGV       Rexene Alberts, MSN, NP-C [facility] [place] [facility] Pager: [phone] Office: [phone]  03/04/21  3:57 PM

## 2021-03-04 NOTE — Patient Instructions (Addendum)
Until your test results come back -  Would isolate with well fitting mask over nose and mouth.  Hopeful that your test results will be back in 3 days.   Still continue your doxycycline twice a day.  Will call you with your results one available

## 2021-03-04 NOTE — Assessment & Plan Note (Signed)
Resolved - continue doxy bid for presumed LGV

## 2021-03-05 ENCOUNTER — Other Ambulatory Visit (HOSPITAL_COMMUNITY): Payer: Self-pay

## 2021-03-06 ENCOUNTER — Telehealth: Payer: Self-pay

## 2021-03-06 LAB — HERPES SIMPLEX VIRUS CULTURE W/RFLX TO TYPING
MICRO NUMBER:: 12310131
SPECIMEN QUALITY:: ADEQUATE

## 2021-03-06 LAB — MONKEYPOX VIRUS DNA, QUALITATIVE REAL-TIME PCR
MONKEYPOX VIRUS DNA, QL PCR: DETECTED — CR
MONKEYPOX VIRUS DNA, QL PCR: DETECTED — CR
Orthopoxvirus DNA, QL PCR: DETECTED — CR
Orthopoxvirus DNA, QL PCR: DETECTED — CR

## 2021-03-06 NOTE — Progress Notes (Signed)
Patient notified of + mpx test results from both lesions. He has not had any further lesions come up Feeling well and no pain / discomfort   Defer tecovorimat treatment at this time. Isolation discussed. Would recommend 3 weeks from first lesions or after all current lesions have scabbed and fallen off, whichever comes first.

## 2021-03-06 NOTE — Telephone Encounter (Signed)
[facility] called to report positive monkeypox test. RN notified provider Rexene Alberts, NP.   Sandie Ano, RN

## 2021-03-11 ENCOUNTER — Other Ambulatory Visit (HOSPITAL_COMMUNITY): Payer: Self-pay

## 2021-03-27 ENCOUNTER — Other Ambulatory Visit (HOSPITAL_COMMUNITY): Payer: Self-pay

## 2021-03-31 ENCOUNTER — Other Ambulatory Visit (HOSPITAL_COMMUNITY): Payer: Self-pay

## 2021-04-01 ENCOUNTER — Other Ambulatory Visit (HOSPITAL_COMMUNITY): Payer: Self-pay

## 2021-04-24 ENCOUNTER — Other Ambulatory Visit (HOSPITAL_COMMUNITY): Payer: Self-pay

## 2021-05-19 ENCOUNTER — Other Ambulatory Visit (HOSPITAL_COMMUNITY): Payer: Self-pay

## 2021-05-19 ENCOUNTER — Other Ambulatory Visit: Payer: Self-pay | Admitting: Infectious Diseases

## 2021-05-19 DIAGNOSIS — B20 Human immunodeficiency virus [HIV] disease: Secondary | ICD-10-CM

## 2021-05-19 MED ORDER — BIKTARVY 50-200-25 MG PO TABS
1.0000 | ORAL_TABLET | Freq: Every day | ORAL | 1 refills | Status: AC
  Filled 2021-05-19 – 2021-06-20 (×2): qty 30, 30d supply, fill #0

## 2021-05-23 ENCOUNTER — Other Ambulatory Visit (HOSPITAL_COMMUNITY): Payer: Self-pay

## 2021-05-26 ENCOUNTER — Other Ambulatory Visit (HOSPITAL_COMMUNITY): Payer: Self-pay

## 2021-06-03 ENCOUNTER — Other Ambulatory Visit (HOSPITAL_COMMUNITY): Payer: Self-pay

## 2021-06-20 ENCOUNTER — Other Ambulatory Visit (HOSPITAL_COMMUNITY): Payer: Self-pay

## 2021-06-24 ENCOUNTER — Other Ambulatory Visit (HOSPITAL_COMMUNITY): Payer: Self-pay

## 2021-07-14 ENCOUNTER — Other Ambulatory Visit (HOSPITAL_COMMUNITY): Payer: Self-pay

## 2021-07-29 ENCOUNTER — Other Ambulatory Visit (HOSPITAL_COMMUNITY): Payer: Self-pay
